# Patient Record
Sex: Male | Born: 1945 | Race: White | Hispanic: No | Marital: Married | Smoking: Never smoker
Health system: Southern US, Academic
[De-identification: ages and names within clinical notes are randomized; demographics above are authoritative.]

## PROBLEM LIST (undated history)

## (undated) DIAGNOSIS — Z85038 Personal history of other malignant neoplasm of large intestine: Secondary | ICD-10-CM

## (undated) DIAGNOSIS — C189 Malignant neoplasm of colon, unspecified: Secondary | ICD-10-CM

## (undated) DIAGNOSIS — M810 Age-related osteoporosis without current pathological fracture: Secondary | ICD-10-CM

## (undated) DIAGNOSIS — E039 Hypothyroidism, unspecified: Secondary | ICD-10-CM

## (undated) HISTORY — DX: Hypothyroidism, unspecified: E03.9

## (undated) HISTORY — PX: ILEOSTOMY: SHX1783

## (undated) HISTORY — DX: Personal history of other malignant neoplasm of large intestine: Z85.038

## (undated) HISTORY — PX: COLONOSCOPY: WVUENDOPRO10

## (undated) HISTORY — DX: Age-related osteoporosis without current pathological fracture: M81.0

---

## 1994-07-24 ENCOUNTER — Emergency Department (HOSPITAL_COMMUNITY): Payer: Self-pay

## 2000-09-26 ENCOUNTER — Other Ambulatory Visit: Payer: Self-pay

## 2002-11-12 ENCOUNTER — Other Ambulatory Visit: Payer: Self-pay

## 2008-05-27 HISTORY — PX: HX SMALL BOWEL RESECTION: SHX150

## 2009-02-02 ENCOUNTER — Other Ambulatory Visit: Payer: Self-pay | Admitting: Surgery

## 2009-03-28 ENCOUNTER — Other Ambulatory Visit: Payer: Self-pay | Admitting: Surgery

## 2010-02-15 ENCOUNTER — Other Ambulatory Visit: Payer: Self-pay | Admitting: Surgery

## 2010-09-09 ENCOUNTER — Emergency Department (EMERGENCY_DEPARTMENT_HOSPITAL): Payer: BC Managed Care – PPO

## 2010-09-09 ENCOUNTER — Emergency Department (EMERGENCY_DEPARTMENT_HOSPITAL)
Admission: EM | Admit: 2010-09-09 | Discharge: 2010-09-10 | Disposition: A | Discharge status: Home / Self Care | Payer: BC Managed Care – PPO

## 2011-02-25 ENCOUNTER — Other Ambulatory Visit: Payer: Self-pay | Admitting: Surgery

## 2011-04-04 HISTORY — PX: HX CATARACT REMOVAL: SHX102

## 2011-04-25 HISTORY — PX: HX CATARACT REMOVAL: SHX102

## 2011-07-02 ENCOUNTER — Other Ambulatory Visit (INDEPENDENT_AMBULATORY_CARE_PROVIDER_SITE_OTHER): Payer: Self-pay | Admitting: UHC-FAMILY MEDICINE

## 2011-07-02 MED ORDER — LEVOTHYROXINE 50 MCG TABLET
50.0000 ug | ORAL_TABLET | Freq: Every day | ORAL | Status: DC
Start: 2011-07-02 — End: 2012-03-23

## 2012-03-02 ENCOUNTER — Other Ambulatory Visit: Payer: Self-pay | Admitting: Surgery

## 2012-03-19 ENCOUNTER — Encounter (INDEPENDENT_AMBULATORY_CARE_PROVIDER_SITE_OTHER): Payer: Self-pay | Admitting: UHC-FAMILY MEDICINE

## 2012-03-19 ENCOUNTER — Ambulatory Visit (INDEPENDENT_AMBULATORY_CARE_PROVIDER_SITE_OTHER): Payer: BC Managed Care – PPO | Admitting: UHC-FAMILY MEDICINE

## 2012-03-19 VITALS — BP 144/94 | HR 78 | Temp 97.0°F | Resp 16 | Ht 71.0 in | Wt 163.0 lb

## 2012-03-19 LAB — THYROXINE, FREE (FREE T4): THYROXINE, FREE (FREE T4): 1.02 ng/dL (ref 0.61–1.12)

## 2012-03-19 LAB — THYROID STIMULATING HORMONE (SENSITIVE TSH): TSH: 2.96 u[IU]/mL (ref 0.340–5.600)

## 2012-03-19 MED ORDER — SILDENAFIL 25 MG TABLET
25.00 mg | ORAL_TABLET | ORAL | Status: DC | PRN
Start: 2012-03-19 — End: 2013-06-28

## 2012-03-20 NOTE — Progress Notes (Addendum)
 [facility]  [facility]  [address]  [place] [address]  [phone]          Encounter Date: 03/19/2012  2:45 PM EDT      Name: [name]  Age: 66 y.o.  DOB: Sep 15, 1945  Sex: male    Chief Complaint:   Chief Complaint   Patient presents with   . Thyroid      Follow Up       HPI  Patient here for f/u. He works at [employer] center. Had blood work done last August. Pt needs refill of synthroid . He wants his TSH recheck because he feels fatigue most of the time lately.  He also wants viagra  prescription.  BP elevated in office. Pt asymptomatic.  Pt has no other complains.  Had his flu shot at [facility].    History  Past Medical History   Diagnosis Date   . Hypothyroid    . Osteoporosis    . H/O colon cancer, stage I      Past Surgical History   Procedure Laterality Date   . Colonoscopy     . Hx small bowel resection  2010       Review of Systems   Constitutional: Negative for fever, chills, weight loss and diaphoresis.   Cardiovascular: Negative for chest pain, palpitations, orthopnea and leg swelling.   Gastrointestinal: Negative for nausea, vomiting, abdominal pain, diarrhea, constipation and blood in stool.   Skin: Negative for rash.       Examination  Vitals: BP 144/94  Pulse 78  Temp(Src) 36.1 C (97 F)  Resp 16  Ht 1.803 m (5' 11)  Wt 73.936 kg (163 lb)  BMI 22.74 kg/m2  SpO2 99%  Physical Exam   Constitutional: He is oriented to person, place, and time.   Cardiovascular: Normal rate and regular rhythm.    No murmur heard.  Pulm:  Effort normal.    There are no rales present.    There are no wheezes present.    Abdomen:   Soft. No tenderness.   Ortho/Musculoskeletal:   Normal range of motion. He exhibits no edema and no tenderness.   Neurological: He is alert and oriented to person, place, and time.   Psychiatric: He has a normal mood and affect. His behavior is normal.     .    Assessment and Plan    Hypothyroidism  - TSH; Future  - FREE T4; Future  -   Continue current dose of synthroid     Erectile dysfunction  - Sildenafil  (VIAGRA ) 25 mg Oral Tablet; Take 1 Tab (25 mg total) by mouth Every 24 hours as needed    Elevated BP  - continue to monitor, pt asymptomatic  - if still elevated on f/u will start medication    Return in about 3 months (around 06/19/2012) for follow up.      Ragena JONELLE Asp, MD      SUPERVISING PHYSICIAN'S STATEMENT    History/Reason for today's visit: As stated in above note.  Exam: As per resident note.  Assessment/Plan: As per resident note.  Preceptor for this encounter: Dr. CANDIE Benton Pfeiffer and prior to or immediately following the patient's discharge today, I reviewed and concur with the history, examination, tests ordered, diagnosis, and plan of the resident physician.    Elvie Benton Pfeiffer, DO

## 2012-03-23 ENCOUNTER — Other Ambulatory Visit (INDEPENDENT_AMBULATORY_CARE_PROVIDER_SITE_OTHER): Payer: Self-pay | Admitting: UHC-FAMILY MEDICINE

## 2012-03-23 MED ORDER — LEVOTHYROXINE 50 MCG TABLET
50.0000 ug | ORAL_TABLET | Freq: Every day | ORAL | Status: DC
Start: 2012-03-23 — End: 2012-03-27

## 2012-03-27 ENCOUNTER — Telehealth (INDEPENDENT_AMBULATORY_CARE_PROVIDER_SITE_OTHER): Payer: Self-pay | Admitting: UHC-FAMILY MEDICINE

## 2012-03-27 MED ORDER — LEVOTHYROXINE 25 MCG TABLET
25.0000 ug | ORAL_TABLET | Freq: Every day | ORAL | Status: DC
Start: 2012-03-27 — End: 2012-09-22

## 2012-03-27 NOTE — Telephone Encounter (Signed)
Patient has been on 50 mcg of Synthroid for past six weeks after being found to have elevated TSH at a health fair.  After six weeks, TSH was in normal range.  Patient was complaining of palpitations, leg pain, and fatigue that had been present for about five days.  He stopped taking Synthroid and symptoms resolved.  Will lower dose of Synthroid to 25 mcg daily and repeat TSH in 6 weeks.    Cassey Bacigalupo Brock Curstin Schmale, MD 03/27/2012, 10:50 AM

## 2012-09-22 ENCOUNTER — Encounter (INDEPENDENT_AMBULATORY_CARE_PROVIDER_SITE_OTHER): Payer: Self-pay | Admitting: UHC-FAMILY MEDICINE

## 2012-09-22 ENCOUNTER — Ambulatory Visit (INDEPENDENT_AMBULATORY_CARE_PROVIDER_SITE_OTHER): Payer: BC Managed Care – PPO | Admitting: UHC-FAMILY MEDICINE

## 2012-09-22 VITALS — BP 130/84 | HR 62 | Temp 98.0°F | Resp 16 | Ht 71.0 in | Wt 162.0 lb

## 2012-09-22 MED ORDER — LEVOTHYROXINE 25 MCG TABLET
25.0000 ug | ORAL_TABLET | Freq: Every day | ORAL | Status: DC
Start: 2012-09-22 — End: 2013-03-05

## 2012-09-22 NOTE — Progress Notes (Addendum)
[facility]  [facility]  [address]  [place] [address]  [phone]          Encounter Date: 09/22/2012  3:15 PM EDT      Name: [name]  Age: 67 y.o.  DOB: Feb 25, 1946  Sex: male    Chief Complaint:   Chief Complaint   Patient presents with   . Neck Pain   . Medication Question       HPI  Pt here for f/u of hypothyroidism.  Brought his annual employee lab results done last March.  Chemistry WNL, CBC WNL, LDL 104, HDL 45, TG 54. TSH done a week ago at 3.6.  He's taking levothyroxine .  He feels jittery with this dose.  He used to take 25 mcg.  He wants to go back to usual dose.    Pt diagnosed with ulcerative colitis and gets colonocopy every year by Dr. Rubye Oaks.  He also complains of neck pain.  Feels like stiff.  Takes motrin which helps with pain.  Moving makes pain worse.  Pain is mild.  No radiation to arms.  No swelling. No weakness    Current Outpatient Prescriptions   Medication Sig   . alendronate (FOSAMAX) 70 mg Oral Tablet Take 70 mg by mouth Every 7 days   . CALCIUM CARBONATE/VITAMIN D3 (CALCIUM 600 + D,3, ORAL) Take by mouth   . cholecalciferol, vitamin D3, 1,000 unit Oral Tablet Take 1,000 Units by mouth Once a day   . levothyroxine (SYNTHROID) 25 mcg Oral Tablet Take 1 Tab (25 mcg total) by mouth Once a day   . Sildenafil (VIAGRA) 25 mg Oral Tablet Take 1 Tab (25 mg total) by mouth Every 24 hours as needed   . sulfaDIAZINE (MICROSULFON) 500 mg Oral Tablet Take 500 mg by mouth Every 6 hours       Review of Systems   Constitutional: Negative for fever and malaise/fatigue.   HENT: Positive for neck pain. Negative for ear pain and sore throat.    Respiratory: Negative for shortness of breath.    Cardiovascular: Negative for chest pain and palpitations.   Gastrointestinal: Negative for abdominal pain, diarrhea and constipation.   Neurological: Negative for tingling, sensory change and focal weakness.       Examination   Vitals: BP 130/84   Pulse 62   Temp(Src) 36.7 C (98 F)   Resp 16   Ht 1.803 m (5\' 11" )   Wt 73.483 kg (162 lb)   BMI 22.6 kg/m2   SpO2 100%  Physical Exam   Constitutional: He is oriented to person, place, and time.   Neck: Normal range of motion. Neck supple. No JVD present. No tracheal deviation present. No thyromegaly present.   Cardiovascular: Normal rate and regular rhythm.    Pulm:  Effort normal and breath sounds normal.    Abdomen:   Soft. No tenderness.   Ortho/Musculoskeletal:   Normal range of motion. He exhibits no edema.   Lymphadenopathy:     He has no cervical adenopathy.   Neurological: He is alert and oriented to person, place, and time.   Skin: No rash noted.   Psychiatric: He has a normal mood and affect. His behavior is normal.     .    Assessment and Plan    Hypothyroid  - TSH; Future, to be done after 4 weeks of decreasing dose of synthroid  -  Will lower dose to 25  mcg, eRX levothyroxine (SYNTHROID) 25 mcg Oral Tablet; Take 1 Tab (25 mcg total) by mouth Once a day    Neck pain on right side  -  Most likely muscle strain  -  Advised home exercise and may take tylenol or ibuprofen prn for pain  -  Will monitor, if perist will order xray and refer to PT      Return in about 4 weeks (around 10/20/2012) for follow up.      Shaune Spittle, MD    ___________________________________________________    History/Reason for today's visit: As stated in above note.    Exam: As per resident note.    Assessment/Plan: As per resident note.      Preceptor for this encounter: Victorico Singzon M.D. and prior to or immediately following the patient's discharge today, I reviewed and concur with the history, examination, tests ordered, diagnosis, and plan of the resident physician.    VICTORICO A. Macie Burows, M.D.  [facility]  [address]  [place] [address]  Dept: [phone]  Dept Fax: [phone]

## 2012-09-29 ENCOUNTER — Encounter (INDEPENDENT_AMBULATORY_CARE_PROVIDER_SITE_OTHER): Payer: BC Managed Care – PPO | Admitting: UHC-FAMILY MEDICINE

## 2012-11-10 ENCOUNTER — Encounter (INDEPENDENT_AMBULATORY_CARE_PROVIDER_SITE_OTHER): Payer: BC Managed Care – PPO | Admitting: UHC-FAMILY MEDICINE

## 2013-02-08 LAB — THYROID STIMULATING HORMONE (SENSITIVE TSH): TSH: 2.42 u[IU]/mL (ref 0.340–5.600)

## 2013-02-11 ENCOUNTER — Encounter (INDEPENDENT_AMBULATORY_CARE_PROVIDER_SITE_OTHER): Payer: BC Managed Care – PPO | Admitting: Family Medicine

## 2013-02-16 ENCOUNTER — Encounter (INDEPENDENT_AMBULATORY_CARE_PROVIDER_SITE_OTHER): Payer: Self-pay | Admitting: Family Medicine

## 2013-02-16 ENCOUNTER — Ambulatory Visit (INDEPENDENT_AMBULATORY_CARE_PROVIDER_SITE_OTHER): Payer: BC Managed Care – PPO | Admitting: Family Medicine

## 2013-02-16 VITALS — BP 160/90 | HR 69 | Temp 97.0°F | Resp 20 | Ht 71.5 in | Wt 161.0 lb

## 2013-02-16 MED ORDER — PROPRANOLOL 10 MG TABLET
10.00 mg | ORAL_TABLET | ORAL | Status: DC
Start: 2013-02-16 — End: 2016-08-13

## 2013-02-16 NOTE — Progress Notes (Addendum)
 [facility]  [address]  [place], [place]  [address]  [phone]    [name]  Jul 18, 1945  02/16/2013 at  2:30 PM EDT    Chief complaint:   Chief Complaint   Patient presents with   . Hypothyroid       Subjective  Patient is 67 y.o. male here to follow up on hypothyroidism. Taking medication with no problem and has no complaints. BP elevated today, but patient states that normally it is much better than this. No chest pains, dizziness, or vision changes. 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 He would like something to perhaps help with it. Also has history of chrohn's disease that he is on sulfazine for and has colonoscopy Oct 9 to see if maybe he can stop this medicine.     Labs from 02/08/13 scanned in and reviewed. MCV elevated-possibly related to medication.     ROS: No fever, chills, nausea, vomiting, CP, SOB or other acute issues.     Current Outpatient Prescriptions   Medication Sig   . alendronate (FOSAMAX) 70 mg Oral Tablet Take 70 mg by mouth Every 7 days   . CALCIUM CARBONATE/VITAMIN D3 (CALCIUM 600 + D,3, ORAL) Take by mouth   . cholecalciferol, vitamin D3, 1,000 unit Oral Tablet Take 1,000 Units by mouth Once a day   . levothyroxine  (SYNTHROID ) 25 mcg Oral Tablet Take 1 Tab (25 mcg total) by mouth Once a day   . propranolol  (INDERAL ) 10 mg Oral Tablet Take 1 Tab (10 mg total) by mouth Per instructions Take one tab PO every 6 hours as needed for tremor   . Sildenafil  (VIAGRA ) 25 mg Oral Tablet Take 1 Tab (25 mg total) by mouth Every 24 hours as needed   . sulfaDIAZINE (MICROSULFON) 500 mg Oral Tablet Take 500 mg by mouth Every 6 hours       Objective  BP 160/90  Pulse 69  Temp(Src) 36.1 C (97 F)  Resp 20  Ht 1.816 m (5' 11.5)  Wt 73.029 kg (161 lb)  BMI 22.14 kg/m2  SpO2 98%   Repeat BP 146/84    BP Readings from Last 4 Encounters:   02/16/13 160/90   09/22/12 130/84   03/19/12 144/94              General Appearance: AAOx3. NAD.  Lungs: CTAB/No WRR.  Heart: RRR/S1S2. No murmurs.  Abdomen: S/NT/ND/BS+  Extremities: No edema.   Neurologic: Coarse bilateral hand tremor bilaterally with intent and at rest. Strength and sensation intact bilateral upper and lower extremities. CN 2-12 grossly intact.   Psychiatric: Normal mood and affect.    Assessment/Plan    ICD-9-CM    1. Hypothyroid 244.9 Stable, continue current rx   2. 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 Elevated BP 796.2 Monitor       The patient was given the opportunity to ask questions and those questions were answered to the patient's satisfaction. The patient was encouraged to call with any additional questions or concerns. Discussed with patient effects and side effects of medication. Instructed patient to call back if symptoms worse.     Return in about 3 months (around 05/18/2013) for f/u tremor.    Darrell LILLETTE Hover, MD 02/18/2013, 1:56 PM  [facility]  [address]  [place] [address]  Dept: [phone]  Dept Fax: [phone]     ___________________________________________________    History/Reason for today's visit: As stated in above note.    Exam: As per resident note.    Assessment/Plan: As per resident note.      Preceptor for this encounter: Victorico Singzon M.D. and prior to or immediately following the patient's discharge today, I reviewed and concur with the history, examination, tests ordered, diagnosis, and plan of the resident physician.    Victorico A. Singzon MD    [address] [place] [address]  Dept: [phone]  Dept Fax: [phone]

## 2013-02-17 ENCOUNTER — Encounter (INDEPENDENT_AMBULATORY_CARE_PROVIDER_SITE_OTHER): Payer: Self-pay | Admitting: Family Medicine

## 2013-02-19 ENCOUNTER — Encounter (INDEPENDENT_AMBULATORY_CARE_PROVIDER_SITE_OTHER): Payer: Self-pay | Admitting: Family Medicine

## 2013-02-22 ENCOUNTER — Encounter (INDEPENDENT_AMBULATORY_CARE_PROVIDER_SITE_OTHER): Payer: Self-pay | Admitting: Family Medicine

## 2013-02-25 ENCOUNTER — Encounter (INDEPENDENT_AMBULATORY_CARE_PROVIDER_SITE_OTHER): Payer: Self-pay | Admitting: Family Medicine

## 2013-03-04 ENCOUNTER — Other Ambulatory Visit: Payer: Self-pay | Admitting: Surgery

## 2013-03-05 ENCOUNTER — Encounter (INDEPENDENT_AMBULATORY_CARE_PROVIDER_SITE_OTHER): Payer: Self-pay | Admitting: Family Medicine

## 2013-03-05 ENCOUNTER — Other Ambulatory Visit (INDEPENDENT_AMBULATORY_CARE_PROVIDER_SITE_OTHER): Payer: Self-pay | Admitting: Family Medicine

## 2013-03-05 DIAGNOSIS — K624 Stenosis of anus and rectum: Secondary | ICD-10-CM

## 2013-03-05 DIAGNOSIS — Z85038 Personal history of other malignant neoplasm of large intestine: Secondary | ICD-10-CM

## 2013-03-05 DIAGNOSIS — K5190 Ulcerative colitis, unspecified, without complications: Secondary | ICD-10-CM

## 2013-03-05 HISTORY — DX: Personal history of other malignant neoplasm of large intestine: Z85.038

## 2013-03-05 HISTORY — DX: Stenosis of anus and rectum: K62.4

## 2013-03-05 HISTORY — DX: Ulcerative colitis, unspecified, without complications (CMS HCC): K51.90

## 2013-03-05 MED ORDER — LEVOTHYROXINE 25 MCG TABLET
25.0000 ug | ORAL_TABLET | Freq: Every day | ORAL | Status: DC
Start: 2013-03-05 — End: 2013-08-05

## 2013-05-27 HISTORY — PX: HX OTHER: 2100001105

## 2013-06-28 ENCOUNTER — Other Ambulatory Visit (INDEPENDENT_AMBULATORY_CARE_PROVIDER_SITE_OTHER): Payer: Self-pay | Admitting: Family Medicine

## 2013-06-28 DIAGNOSIS — N529 Male erectile dysfunction, unspecified: Secondary | ICD-10-CM

## 2013-06-29 MED ORDER — SILDENAFIL 25 MG TABLET
25.0000 mg | ORAL_TABLET | ORAL | Status: DC | PRN
Start: 2013-06-28 — End: 2016-04-07

## 2013-08-05 ENCOUNTER — Other Ambulatory Visit (INDEPENDENT_AMBULATORY_CARE_PROVIDER_SITE_OTHER): Payer: Self-pay | Admitting: Family Medicine

## 2013-08-05 DIAGNOSIS — E039 Hypothyroidism, unspecified: Secondary | ICD-10-CM

## 2013-08-05 MED ORDER — LEVOTHYROXINE 50 MCG TABLET
50.0000 ug | ORAL_TABLET | Freq: Every day | ORAL | Status: DC
Start: 2013-08-05 — End: 2016-04-07

## 2013-08-12 ENCOUNTER — Other Ambulatory Visit (INDEPENDENT_AMBULATORY_CARE_PROVIDER_SITE_OTHER): Payer: Self-pay | Admitting: Family Medicine

## 2013-08-12 ENCOUNTER — Encounter (INDEPENDENT_AMBULATORY_CARE_PROVIDER_SITE_OTHER): Payer: Self-pay | Admitting: Family Medicine

## 2013-08-12 DIAGNOSIS — E039 Hypothyroidism, unspecified: Secondary | ICD-10-CM

## 2013-08-20 ENCOUNTER — Encounter (INDEPENDENT_AMBULATORY_CARE_PROVIDER_SITE_OTHER): Payer: Self-pay | Admitting: Family Medicine

## 2013-09-24 ENCOUNTER — Encounter (INDEPENDENT_AMBULATORY_CARE_PROVIDER_SITE_OTHER): Payer: BC Managed Care – PPO | Admitting: Surgery

## 2013-09-24 DIAGNOSIS — K625 Hemorrhage of anus and rectum: Secondary | ICD-10-CM

## 2013-09-24 DIAGNOSIS — K5180 Other ulcerative colitis without complications: Secondary | ICD-10-CM

## 2013-09-29 ENCOUNTER — Encounter (INDEPENDENT_AMBULATORY_CARE_PROVIDER_SITE_OTHER): Payer: Self-pay | Admitting: Family Medicine

## 2013-09-29 LAB — THYROID STIMULATING HORMONE (SENSITIVE TSH): TSH: 3.35 u[IU]/mL (ref 0.340–5.600)

## 2013-09-29 LAB — THYROXINE, FREE (FREE T4): THYROXINE, FREE (FREE T4): 1.01 ng/dL (ref 0.61–1.12)

## 2013-11-03 ENCOUNTER — Encounter (INDEPENDENT_AMBULATORY_CARE_PROVIDER_SITE_OTHER): Payer: Self-pay | Admitting: Family Medicine

## 2013-11-12 ENCOUNTER — Encounter (INDEPENDENT_AMBULATORY_CARE_PROVIDER_SITE_OTHER): Payer: BC Managed Care – PPO | Admitting: Surgery

## 2013-11-12 DIAGNOSIS — K625 Hemorrhage of anus and rectum: Secondary | ICD-10-CM

## 2013-11-12 DIAGNOSIS — Z85038 Personal history of other malignant neoplasm of large intestine: Secondary | ICD-10-CM

## 2013-11-30 LAB — THYROXINE, FREE (FREE T4): THYROXINE, FREE (FREE T4): 1.09 ng/dL (ref 0.61–1.12)

## 2013-11-30 LAB — THYROID STIMULATING HORMONE (SENSITIVE TSH): TSH: 4.421 u[IU]/mL (ref 0.340–5.600)

## 2013-12-07 ENCOUNTER — Encounter (INDEPENDENT_AMBULATORY_CARE_PROVIDER_SITE_OTHER): Payer: Self-pay | Admitting: Family Medicine

## 2013-12-09 ENCOUNTER — Encounter (INDEPENDENT_AMBULATORY_CARE_PROVIDER_SITE_OTHER): Payer: Self-pay | Admitting: Family Medicine

## 2014-03-07 ENCOUNTER — Other Ambulatory Visit: Payer: Self-pay | Admitting: Surgery

## 2014-03-07 DIAGNOSIS — Z980 Intestinal bypass and anastomosis status: Secondary | ICD-10-CM

## 2014-03-07 DIAGNOSIS — Z85038 Personal history of other malignant neoplasm of large intestine: Secondary | ICD-10-CM

## 2014-03-07 DIAGNOSIS — C187 Malignant neoplasm of sigmoid colon: Secondary | ICD-10-CM

## 2014-03-07 DIAGNOSIS — K625 Hemorrhage of anus and rectum: Secondary | ICD-10-CM

## 2014-03-07 DIAGNOSIS — K624 Stenosis of anus and rectum: Secondary | ICD-10-CM

## 2014-03-07 DIAGNOSIS — K5190 Ulcerative colitis, unspecified, without complications: Secondary | ICD-10-CM

## 2014-03-11 ENCOUNTER — Encounter (INDEPENDENT_AMBULATORY_CARE_PROVIDER_SITE_OTHER): Payer: BC Managed Care – PPO | Admitting: Surgery

## 2014-03-11 DIAGNOSIS — K5190 Ulcerative colitis, unspecified, without complications: Secondary | ICD-10-CM

## 2014-03-23 ENCOUNTER — Other Ambulatory Visit: Payer: Self-pay | Admitting: Surgery

## 2014-03-23 DIAGNOSIS — K5190 Ulcerative colitis, unspecified, without complications: Secondary | ICD-10-CM

## 2014-03-23 DIAGNOSIS — C187 Malignant neoplasm of sigmoid colon: Secondary | ICD-10-CM

## 2014-03-28 DIAGNOSIS — C187 Malignant neoplasm of sigmoid colon: Secondary | ICD-10-CM

## 2014-04-03 ENCOUNTER — Emergency Department (EMERGENCY_DEPARTMENT_HOSPITAL)
Admission: EM | Admit: 2014-04-03 | Discharge: 2014-04-03 | Payer: BC Managed Care – PPO | Attending: EMERGENCY MEDICINE | Admitting: EMERGENCY MEDICINE

## 2014-04-03 ENCOUNTER — Emergency Department (EMERGENCY_DEPARTMENT_HOSPITAL): Payer: BC Managed Care – PPO

## 2014-04-03 DIAGNOSIS — R112 Nausea with vomiting, unspecified: Secondary | ICD-10-CM

## 2014-04-03 DIAGNOSIS — E860 Dehydration: Secondary | ICD-10-CM

## 2014-04-03 DIAGNOSIS — R0602 Shortness of breath: Secondary | ICD-10-CM

## 2014-04-03 DIAGNOSIS — K5660 Unspecified intestinal obstruction: Secondary | ICD-10-CM

## 2014-04-06 ENCOUNTER — Encounter (INDEPENDENT_AMBULATORY_CARE_PROVIDER_SITE_OTHER): Payer: BC Managed Care – PPO | Admitting: Surgery

## 2014-04-13 ENCOUNTER — Encounter (INDEPENDENT_AMBULATORY_CARE_PROVIDER_SITE_OTHER): Payer: BC Managed Care – PPO | Admitting: Surgery

## 2014-04-13 DIAGNOSIS — Z09 Encounter for follow-up examination after completed treatment for conditions other than malignant neoplasm: Secondary | ICD-10-CM

## 2014-04-15 ENCOUNTER — Encounter (INDEPENDENT_AMBULATORY_CARE_PROVIDER_SITE_OTHER): Payer: Self-pay | Admitting: Family Medicine

## 2014-05-25 ENCOUNTER — Encounter (INDEPENDENT_AMBULATORY_CARE_PROVIDER_SITE_OTHER): Payer: Self-pay | Admitting: Family Medicine

## 2014-05-25 ENCOUNTER — Ambulatory Visit (INDEPENDENT_AMBULATORY_CARE_PROVIDER_SITE_OTHER): Payer: BC Managed Care – PPO | Admitting: Family Medicine

## 2014-05-25 DIAGNOSIS — D649 Anemia, unspecified: Secondary | ICD-10-CM | POA: Insufficient documentation

## 2014-05-25 DIAGNOSIS — E875 Hyperkalemia: Secondary | ICD-10-CM

## 2014-05-25 DIAGNOSIS — E039 Hypothyroidism, unspecified: Secondary | ICD-10-CM

## 2014-05-25 DIAGNOSIS — Z1159 Encounter for screening for other viral diseases: Secondary | ICD-10-CM

## 2014-05-25 DIAGNOSIS — Z6820 Body mass index (BMI) 20.0-20.9, adult: Secondary | ICD-10-CM

## 2014-05-25 NOTE — Progress Notes (Addendum)
[facility]  [address]  [place], [place] [address]  Phone: [phone]  Fax: [phone]          Name: [name]  MRN : [id number]  Date of service: 05/25/2014    Chief Complaint: Anemia    History of Present Illness:  [name] is a 68 y.o. male who presents as an established patient to the Family Medicine clinic at [facility].  Patient comes in for follow up. Had labs done recently per Dr. Tonye Royalty.   Anemia - He was found to be anemic. Hgb 10.1 Started on Ferrous gluconate. States he feels tired sometimes.  Hyperkalemia - was previously on Potassium pills. Was d/c last Monday. Will recheck levels next week. Patient is asymptomatic.  Hypothyroidism - follows with Dr. Tonye Royalty. He uses Armour thyroid pills as he can't tolerate synthroid. Recent TSH levels were WNL.  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 Patient was admitted last October after undergoing colonoscopy. 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 Doing well.     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    History of malignant neoplasm of large intestine 03/05/2013    Ulcerative colitis 03/05/2013           Medications:  Current Outpatient Prescriptions   Medication Sig Dispense Refill    alendronate (FOSAMAX) 70 mg Oral Tablet Take 70 mg by mouth Every 7 days      CALCIUM CARBONATE/VITAMIN D3 (CALCIUM 600 + D,3, ORAL) Take by mouth      cholecalciferol, vitamin D3, 1,000 unit Oral Tablet Take 1,000 Units by mouth Once a day      levothyroxine (SYNTHROID) 50 mcg Oral Tablet Take 1 Tab (50 mcg total) by mouth Once a day (Patient not taking: Reported on 05/25/2014) 30 Tab 3    propranolol (INDERAL) 10 mg Oral Tablet Take 1 Tab (10 mg total) by mouth Per instructions Take one tab PO every 6 hours as needed for tremor 60 Tab 3    Sildenafil (VIAGRA) 25 mg Oral  Tablet Take 1 Tab (25 mg total) by mouth Every 24 hours as needed (Patient not taking: Reported on 05/25/2014) 7 Tab 1    sulfaDIAZINE (MICROSULFON) 500 mg Oral Tablet Take 500 mg by mouth Every 6 hours      thyroid (ARMOUR THYROID) 60 mg Oral Tablet Take 60 mg by mouth Once a day       No current facility-administered medications for this visit.       Problem List:  Patient Active Problem List   Diagnosis    Hypothyroid    Neck pain    Elevated BP    Stenosis of rectum    History of malignant neoplasm of large intestine    Ulcerative colitis    Hyperkalemia       Review of Systems:  Constitutional - no appetite or weight changes, (+)fatigue , no fevers, chills, or night sweats  Respiratory - no dyspnea or cough  Cardiovascular - no chest pain or palpitations  Gastrointestinal - no nausea, vomiting, diarrhea, or constipation, no dyspepsia  Endocrine - no heat or cold intolerance, no polyuria, polydipsia, or polyphagia  Skin - no rashes, color changes, or lesions  Musculoskeletal - no arthralgias or myalgias  Genitourinary - no urinary  frequency or dysuria, no genital discharge  Neurologic - no vision or hearing changes, no weakness, no parasthesias   Psychiatric - mood has been appropriate, no depression or anxiety    Physical Examination:  BP 137/84 mmHg   Pulse 86   Temp(Src) 37.1 C (98.8 F)   Resp 20   Ht 1.803 m ($Remove'5\' 11"'vWxlknp$ )   Wt 66.225 kg (146 lb)   BMI 20.37 kg/m2   SpO2 100%  General Appearance: AF. VSS. AAOx3. NAD.  HEENT: (+) pink palpebral conjunctivae.  Neck: Trachea midline. No cervical lymphadenopathy. Thyroid not enlarged.   Lungs: Nonlabored with symmetric movement. CTAB/No WRR.  Heart: RRR/S1S2. No murmurs. Pulses equal bilaterally.  Abdomen: S/NT/ND/BS+/no hepatospenomegaly. (+)ileostomy LLQ  Extremities: Warm and dry. No edema. Intact strength and sensation.   Neuro: CN II-XII grossly intact, normal gait.  Psychiatric: Normal mood and affect. No SI/HI.    Results:  TEST: TSH     Result Name  Results Units Reference Range   TSH  4.171  uIU/mL  0.340-5.600     TEST: LDH     Result Name Results Units Reference Range   LDH  144  U/L  98-192     TEST: LIPID PANEL     Result Name Results Units Reference Range   CHOLESTEROL  137  mg/dL  0-199   TRIGLYCERIDES  59  mg/dL  0-199   HDL  44  mg/dL  29-71   LDL DIRECT  75  mg/dL  0-99   VLDL  12  mg/dL  0-50     TEST: COMPREHENSIVE METABOLIC PANEL     Result Name Results Units Reference Range   SODIUM  138  mEq/L  136-144   POTASSIUM  5.3 H  mEq/L  3.6-5.1   CHLORIDE  103  mEq/L  101-111   CO2 TOTAL  27  mEq/L  22-32   BUN  20  mg/dL  8-20   CREATININE, SERUM  0.6  mg/dL  0.6-1.2   GLUCOSE  78  mg/dL  70-110   CALCIUM  9.7  mg/dL  8.9-10.3   BILIRUBIN, TOTAL  0.3  mg/dL  0.3-1.2   Full Term:   0.0 - 6.0 (Birth to 6 days)  Premature: 0.0 - 12.9 (Birth to 7 days)  T. Bilirubin Reference Range for Newborns: Full Term:   0.0 - 6.0 (Birth  to 6 days); Premature: 0.0 - 12.9 (Birth to 7 days)   Samples from patients who have taken Naproxen have shown spurious  elevation in Total Bilirubin levels due to metabolite  O-desmethylnaproxen interference with Jendrassik-Grof methods.   ALK. PHOSPHATASE  51  U/L  20-130   AST  16  U/L  8-33   ALT  16  U/L  4-36   PROTEIN, TOTAL  6.4  gm/dL  6.4-8.3   ALBUMIN  3.3 L  gm/dL  3.5-4.8   eGFR  >60 A   Avg. 85     TEST: PHOSPHORUS     Result Name Results Units Reference Range   PHORPHORUS, INORGANIC  4.9 H  mg/dL  2.4-4.7     TEST: URIC ACID     Result Name Results Units Reference Range   URIC ACID  3.0 L  mg/dL  4.8-8.7     TEST: CBC     Result Name Results Units Reference Range   WBC  6.5  10^3/uL  3.3-9.3   RBC  3.52 L  10^6/uL  4.40-5.68   HEMOGLOBIN  10.1 L  gm/dL  13.4-17.3   HEMATOCRIT  31.0 L  %  38.9-50.5   MCV  88.0  fl  82.4-95.0   MCH  28.6  pg  27.9-33.1   MCHC  32.5 L  gm/dL  32.8-36.0   RDW  17.1 H  %  10.9-15.1   PLATELET COUNT  341  10^3/uL  140-440   MPV  8.0  fl  6.0-10.2   % GRANULOCYTES  65.6  %     % LYMPHOCYTE   19.1  %     % MONOCYTE  10.1  %     % EOSINOPHILS  4.7  %     % BASOPHILS  0.5  %     # GRANULOCYTE  4.3  10^3/uL  1.6-5.5   # LYMPHOCYTES  1.2  10^3/uL  0.8-3.2   # MONOCYTES  0.7  10^3/uL  0.2-0.8   # EOSINOPHILS  0.3  10^3/uL  0.0-0.5   # BASOPHILS  0.00  10^3/uL  0.00-0.20     TEST: MAGNESIUM     Result Name Results Units Reference Range   MAGNESIUM  2.0  mg/dL  1.8-2.5     Assessment and Plan:    ICD-10-CM    1. Anemia, unspecified anemia type D64.9 FERRITIN     IRON     TRANSFERRIN     TOTAL IRON BINDING CAPACITY     VITAMIN B12   FOLATE        Continue Ferrous Gluconate.         2. Hypothyroidism, unspecified hypothyroidism type E03.9 Follows with Dr. Tonye Royalty. Continue Armour thyroid.   3. Hyperkalemia E87.5 POTASSIUM   4. Need for hepatitis C screening test Z11.59 HEPATITIS C ANTIBODY SCREEN WITH REFLEX TO HCV PCR   5. Hyperphosphatemia E83.89 PHOSPHORUS       If anything comes up in the mean time, I advised the patient to let me know and I would be more than happy to speak with them. If it cannot wait to please go to Urgent Care or ER.   The patient was given the opportunity to ask questions and those questions were answered to the patient's satisfaction. The patient was encouraged to call with any additional questions or concerns. Instructed patient to call back if symptoms worse.     Orders Placed This Encounter    FERRITIN    IRON    TRANSFERRIN    TOTAL IRON BINDING CAPACITY    POTASSIUM (K)    B12    FOLATE    ANTI HCV       Education  Medication safety and fall prevention    Follow up: Return in about 3 months (around 08/24/2014) for Anemia.        Durenda Age, MD 05/25/2014, 16:10    [facility]  [address] [place] [address]   Dept: [phone]   Dept Fax: [phone]       ___________________________________________________  SUPERVISING PHYSICIAN'S STATEMENT:    History/Reason for today's visit: As stated in above note.   Exam: As per resident note.      Assessment/Plan: As per resident note.   Preceptor for this encounter: Candis Musa M.D. and prior to or immediately following the patient's discharge today, I reviewed and concur with the history, examination, tests ordered, diagnosis, and plan of the resident physician.    Candis Musa, M.D.    [address]  [place], [place] [address]  Phone: [phone]  Fax:     [phone]

## 2014-05-25 NOTE — Patient Instructions (Addendum)
1. Continue to take medications as directed.   2. Follow-up as scheduled.   3. If any problems, call the family medicine clinic at [phone] during normal business hours, call the hospital operator after hours and ask for the resident on call for family medicine, or go to the emergency department for immediate evaluation if it is an emergency.    Patient was counseled about lifestyle modification including increasing physical activity and proper diet including balanced diet and portion control. Patient also counseled about age-appropriate health maintenance issues including obtaining regular lab work, eye exams, dental exams, screening procedures/tests, vaccination, and appropriate vitamin supplementation. Also instructed to follow-up with specialists as scheduled.  Anemia, Nonspecific  Anemia is a condition in which the concentration of red blood cells or hemoglobin in the blood is below normal. Hemoglobin is a substance in red blood cells that carries oxygen to the tissues of the body. Anemia results in not enough oxygen reaching these tissues.   CAUSES   Common causes of anemia include:    Excessive bleeding. Bleeding may be internal or external. This includes excessive bleeding from periods (in women) or from the intestine.    Poor nutrition.    Chronic kidney, thyroid, and liver disease.   Bone marrow disorders that decrease red blood cell production.   Cancer and treatments for cancer.   HIV, AIDS, and their treatments.   Spleen problems that increase red blood cell destruction.   Blood disorders.   Excess destruction of red blood cells due to infection, medicines, and autoimmune disorders.  SIGNS AND SYMPTOMS    Minor weakness.    Dizziness.    Headache.   Palpitations.    Shortness of breath, especially with exercise.    Paleness.   Cold sensitivity.   Indigestion.   Nausea.   Difficulty sleeping.   Difficulty concentrating.  Symptoms may occur suddenly or they may develop  slowly.   DIAGNOSIS   Additional blood tests are often needed. These help your health care provider determine the best treatment. Your health care provider will check your stool for blood and look for other causes of blood loss.   TREATMENT   Treatment varies depending on the cause of the anemia. Treatment can include:    Supplements of iron, vitamin M38, or folic acid.    Hormone medicines.    A blood transfusion. This may be needed if blood loss is severe.    Hospitalization. This may be needed if there is significant continual blood loss.    Dietary changes.   Spleen removal.  HOME CARE INSTRUCTIONS  Keep all follow-up appointments. It often takes many weeks to correct anemia, and having your health care provider check on your condition and your response to treatment is very important.  SEEK IMMEDIATE MEDICAL CARE IF:    You develop extreme weakness, shortness of breath, or chest pain.    You become dizzy or have trouble concentrating.   You develop heavy vaginal bleeding.    You develop a rash.    You have bloody or black, tarry stools.    You faint.    You vomit up blood.    You vomit repeatedly.    You have abdominal pain.   You have a fever or persistent symptoms for more than 2-3 days.    You have a fever and your symptoms suddenly get worse.    You are dehydrated.   MAKE SURE YOU:   Understand these instructions.   Will watch your condition.  Will get help right away if you are not doing well or get worse.     This information is not intended to replace advice given to you by your health care provider. Make sure you discuss any questions you have with your health care provider.     Document Released: 06/20/2004 Document Revised: 01/13/2013 Document Reviewed: 11/06/2012  [facility] Patient Information 2015 [place], [place].

## 2014-05-30 LAB — IRON TRANSFERRIN AND TIBC
IRON BINDING CAPACITY: 340 ug/dL (ref 221–468)
IRON SATURATION: 8 % — ABNORMAL LOW (ref 20–50)
IRON: 26 ug/dL — ABNORMAL LOW (ref 45–182)
TRANSFERRIN: 243 mg/dL (ref 180–329)

## 2014-05-30 LAB — VITAMIN B12: VITAMIN B12: >1500 pg/mL — ABNORMAL HIGH (ref 180–914)

## 2014-05-30 LAB — FERRITIN: FERRITIN: 12 ng/mL — ABNORMAL LOW (ref 24–336)

## 2014-05-30 LAB — POTASSIUM: POTASSIUM: 4.4 mEq/L (ref 3.6–5.1)

## 2014-05-30 LAB — FOLATE: FOLATE: >24.8 ng/mL — AB (ref >5.9)

## 2014-05-30 LAB — HEPATITIS C ANTIBODY SCREEN WITH REFLEX TO HCV PCR: HEPATITIS C ANTIBODY: NEGATIVE

## 2014-06-02 LAB — PHOSPHORUS: PHOSPHORUS: 4.3 mg/dL (ref 2.4–4.7)

## 2014-06-09 ENCOUNTER — Emergency Department (EMERGENCY_DEPARTMENT_HOSPITAL)
Admission: EM | Admit: 2014-06-09 | Discharge: 2014-06-12 | Payer: BC Managed Care – PPO | Attending: EMERGENCY MEDICINE | Admitting: EMERGENCY MEDICINE

## 2014-06-09 ENCOUNTER — Emergency Department (EMERGENCY_DEPARTMENT_HOSPITAL): Payer: BC Managed Care – PPO

## 2014-06-09 DIAGNOSIS — K458 Other specified abdominal hernia without obstruction or gangrene: Secondary | ICD-10-CM

## 2014-06-10 DIAGNOSIS — Z85038 Personal history of other malignant neoplasm of large intestine: Secondary | ICD-10-CM

## 2014-06-10 DIAGNOSIS — K9419 Other complications of enterostomy: Secondary | ICD-10-CM

## 2014-06-10 DIAGNOSIS — K5660 Unspecified intestinal obstruction: Secondary | ICD-10-CM

## 2014-06-29 ENCOUNTER — Encounter (INDEPENDENT_AMBULATORY_CARE_PROVIDER_SITE_OTHER): Payer: Self-pay | Admitting: HEMATOLOGY/ONCOLOGY

## 2014-06-29 ENCOUNTER — Encounter (INDEPENDENT_AMBULATORY_CARE_PROVIDER_SITE_OTHER): Payer: BC Managed Care – PPO | Admitting: Family Medicine

## 2014-06-29 ENCOUNTER — Encounter (INDEPENDENT_AMBULATORY_CARE_PROVIDER_SITE_OTHER): Payer: BC Managed Care – PPO | Admitting: Surgery

## 2014-06-29 ENCOUNTER — Encounter (INDEPENDENT_AMBULATORY_CARE_PROVIDER_SITE_OTHER): Payer: Self-pay | Admitting: Surgery

## 2014-06-29 DIAGNOSIS — Z09 Encounter for follow-up examination after completed treatment for conditions other than malignant neoplasm: Secondary | ICD-10-CM

## 2014-07-13 ENCOUNTER — Encounter (INDEPENDENT_AMBULATORY_CARE_PROVIDER_SITE_OTHER): Payer: Self-pay | Admitting: Family Medicine

## 2014-07-13 ENCOUNTER — Ambulatory Visit (INDEPENDENT_AMBULATORY_CARE_PROVIDER_SITE_OTHER): Payer: BC Managed Care – PPO | Admitting: Family Medicine

## 2014-07-13 ENCOUNTER — Encounter (INDEPENDENT_AMBULATORY_CARE_PROVIDER_SITE_OTHER): Payer: Self-pay

## 2014-07-13 VITALS — HR 73 | Temp 96.9°F | Resp 20 | Ht 72.0 in | Wt 147.0 lb

## 2014-07-13 DIAGNOSIS — Z0000 Encounter for general adult medical examination without abnormal findings: Secondary | ICD-10-CM

## 2014-07-13 DIAGNOSIS — Z681 Body mass index (BMI) 19 or less, adult: Secondary | ICD-10-CM

## 2014-07-13 NOTE — Patient Instructions (Signed)
1. Continue to take medications as directed.   2. Follow-up as scheduled.   3. If any problems, call the family medicine clinic at [phone] during normal business hours, call the hospital operator after hours and ask for the resident on call for family medicine, or go to the emergency department for immediate evaluation if it is an emergency.    Patient was counseled about lifestyle modification including increasing physical activity and proper diet including balanced diet and portion control. Patient also counseled about age-appropriate health maintenance issues including obtaining regular lab work, eye exams, dental exams, screening procedures/tests, vaccination, and appropriate vitamin supplementation. Also instructed to follow-up with specialists as scheduled.

## 2014-07-14 NOTE — Progress Notes (Addendum)
[facility]  [address]  [place], [place] [address]  Phone: [phone]  Fax: [phone]          Name: [name]  MRN : [id number]  Date of service: 07/13/2014    Chief Complaint: Annual Wellness Exam    History of Present Illness:  [name] is a 69 y.o. male who presents as an established patient to the Family Medicine clinic at [facility].  Patient comes in for his wellness check. Patient doing well. No complaints. Denies fever, chills, nausea, vomiting, chest pain, or shortness of breath.    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    History of malignant neoplasm of large intestine 03/05/2013    Ulcerative colitis 03/05/2013           Medications:  Current Outpatient Prescriptions   Medication Sig Dispense Refill    alendronate (FOSAMAX) 70 mg Oral Tablet Take 70 mg by mouth Every 7 days      CALCIUM CARBONATE/VITAMIN D3 (CALCIUM 600 + D,3, ORAL) Take by mouth      cholecalciferol, vitamin D3, 1,000 unit Oral Tablet Take 1,000 Units by mouth Once a day      levothyroxine (SYNTHROID) 50 mcg Oral Tablet Take 1 Tab (50 mcg total) by mouth Once a day (Patient not taking: Reported on 05/25/2014) 30 Tab 3    propranolol (INDERAL) 10 mg Oral Tablet Take 1 Tab (10 mg total) by mouth Per instructions Take one tab PO every 6 hours as needed for tremor 60 Tab 3    Sildenafil (VIAGRA) 25 mg Oral Tablet Take 1 Tab (25 mg total) by mouth Every 24 hours as needed 7 Tab 1    thyroid (ARMOUR THYROID) 60 mg Oral Tablet Take 60 mg by mouth Once a day       No current facility-administered medications for this visit.       Problem List:  Patient Active Problem List   Diagnosis    Hypothyroid    Neck pain    Elevated BP    Stenosis of rectum    History of malignant neoplasm of large intestine    Ulcerative colitis    Hyperkalemia    Hyperphosphatemia       Anemia, unspecified anemia type       Review of Systems:  Constitutional - no appetite or weight changes, no fatigue, no fevers, chills, or night sweats  Respiratory - no dyspnea or cough  Cardiovascular - no chest pain or palpitations  Gastrointestinal - no nausea, vomiting, diarrhea, or constipation, no dyspepsia  Endocrine - no heat or cold intolerance, no polyuria, polydipsia, or polyphagia  Skin - no rashes, color changes, or lesions  Musculoskeletal - no arthralgias or myalgias  Genitourinary - no urinary frequency or dysuria, no genital discharge  Neurologic - no vision or hearing changes, no weakness, no parasthesias   Psychiatric - mood has been appropriate, no depression or anxiety    Physical Examination:  Pulse 73   Temp(Src) 36.1 C (96.9 F)   Resp 20   Ht 1.829 m (6')   Wt 66.679 kg (147 lb)   BMI 19.93 kg/m2   SpO2 100%  General Appearance: AF. VSS. AAOx3. NAD.  HEENT: PERRLA/EOMI/TMs normal/ No throat erythema or drainage/MMM  Neck: Trachea midline.  No cervical lymphadenopathy. Thyroid not enlarged. No carotid bruit.  Lungs: Nonlabored with symmetric movement. CTAB/No WRR.  Heart: RRR/S1S2. No murmurs. Pulses equal bilaterally.  Abdomen: S/NT/ND/BS+/no hepatospenomegaly (+)colostomy bag RLQ  Extremities: Warm and dry. No edema. Intact strength and sensation.   Neuro: CN II-XII grossly intact, normal gait.  Psychiatric: Normal mood and affect. No SI/HI.    Results:  05/16/2014  Result Name Results Units Reference Range   CHOLESTEROL  137  mg/dL  0-199   TRIGLYCERIDES  59  mg/dL  0-199   HDL  44  mg/dL  29-71   LDL DIRECT  75  mg/dL  0-99   VLDL  12  mg/dL  0-50     Result Name Results Units Reference Range   SODIUM  138  mEq/L  136-144   POTASSIUM  5.3 H  mEq/L  3.6-5.1   CHLORIDE  103  mEq/L  101-111   CO2 TOTAL  27  mEq/L  22-32   BUN  20  mg/dL  8-20   CREATININE, SERUM  0.6  mg/dL  0.6-1.2   GLUCOSE  78  mg/dL  70-110   CALCIUM  9.7  mg/dL  8.9-10.3   BILIRUBIN, TOTAL  0.3  mg/dL  0.3-1.2    Full Term:   0.0 - 6.0 (Birth to 6 days)  Premature: 0.0 - 12.9 (Birth to 7 days)  T. Bilirubin Reference Range for Newborns: Full Term:   0.0 - 6.0 (Birth  to 6 days); Premature: 0.0 - 12.9 (Birth to 7 days)   Samples from patients who have taken Naproxen have shown spurious  elevation in Total Bilirubin levels due to metabolite  O-desmethylnaproxen interference with Jendrassik-Grof methods.   ALK. PHOSPHATASE  51  U/L  20-130   AST  16  U/L  8-33   ALT  16  U/L  4-36   PROTEIN, TOTAL  6.4  gm/dL  6.4-8.3   ALBUMIN  3.3 L  gm/dL  3.5-4.8   eGFR  >60 A   Avg. 85         Assessment and Plan:    ICD-10-CM    1. Encounter for general adult medical examination w/o abnormal findings Z00.00 Discussed healthy lifestyle choices.    Dr. Awanda Mink was present during the visit.    If anything comes up in the mean time, I advised the patient to let me know and I would be more than happy to speak with them. If it cannot wait to please go to Urgent Care or ER.   The patient was given the opportunity to ask questions and those questions were answered to the patient's satisfaction. The patient was encouraged to call with any additional questions or concerns. Instructed patient to call back if symptoms worse.     No orders of the defined types were placed in this encounter.       Education  Medication safety and fall prevention    Follow up: Return in about 6 weeks (around 08/24/2014) for Hospital follow up.        Durenda Age, MD 07/14/2014, 15:14    [facility]  [address] [place] [address]   Dept: [phone]   Dept Fax: [phone]     Supervising Physician's Statement    History/Reason for today's visit: As stated in above note.  Exam: As per resident note.  Assessment/Plan: As per resident note.  Preceptor for this encounter: Dr. Rodell Perna and prior to  the patient's discharge today, I was present in the key portions of service as it was rendered.  I discussed medical decision making  with the resident physician before the end of the encounter.    Rodell Perna, MD

## 2014-08-24 ENCOUNTER — Ambulatory Visit (INDEPENDENT_AMBULATORY_CARE_PROVIDER_SITE_OTHER): Payer: BC Managed Care – PPO | Admitting: Family Medicine

## 2014-08-24 ENCOUNTER — Encounter (INDEPENDENT_AMBULATORY_CARE_PROVIDER_SITE_OTHER): Payer: Self-pay | Admitting: Family Medicine

## 2014-08-24 VITALS — BP 138/90 | HR 69 | Temp 97.0°F | Resp 20 | Ht 71.0 in | Wt 152.0 lb

## 2014-08-24 DIAGNOSIS — E039 Hypothyroidism, unspecified: Secondary | ICD-10-CM

## 2014-08-24 DIAGNOSIS — D649 Anemia, unspecified: Secondary | ICD-10-CM

## 2014-08-24 DIAGNOSIS — Z6821 Body mass index (BMI) 21.0-21.9, adult: Secondary | ICD-10-CM

## 2014-08-24 DIAGNOSIS — Z85038 Personal history of other malignant neoplasm of large intestine: Secondary | ICD-10-CM

## 2014-08-24 MED ORDER — FERROUS SULFATE 324 MG (65 MG IRON) TABLET,DELAYED RELEASE
324.0000 mg | DELAYED_RELEASE_TABLET | Freq: Every morning | ORAL | Status: DC
Start: 2014-08-24 — End: 2015-01-04

## 2014-08-24 NOTE — Patient Instructions (Signed)
1. Continue to take medications as directed.   2. Follow-up as scheduled.   3. If any problems, call the family medicine clinic at [phone] during normal business hours, call the hospital operator after hours and ask for the resident on call for family medicine, or go to the emergency department for immediate evaluation if it is an emergency.    Patient was counseled about lifestyle modification including increasing physical activity and proper diet including balanced diet and portion control. Patient also counseled about age-appropriate health maintenance issues including obtaining regular lab work, eye exams, dental exams, screening procedures/tests, vaccination, and appropriate vitamin supplementation. Also instructed to follow-up with specialists as scheduled.

## 2014-08-25 NOTE — Progress Notes (Addendum)
[facility]  [address]  [place], [place] [address]  Phone: [phone]  Fax: [phone]          Name: [name]  MRN : [id number]  Date of service: 08/24/2014    Chief Complaint: Hypothyroid    History of Present Illness:  [name] is a 69 y.o. male who presents as an established patient to the Family Medicine clinic at [facility].  Patient comes in for follow up. Patient was seen last time for [facility] visit. Patient is doing very well. Had labs done recently and can be located in the media section.  Anemia - stable. Patient currently on ferrous sulfate. Hgb 11.3. Feels less tired.  Hyperkalemia - K is 4.4. Stable. No complaints.  Hypothyroidism - compliant with meds. Feels less tired.  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 (+)Ileostomy bag LLQ. Doing well.  Overall, patient states he feels much better and less tired. Denies CP/SOB.     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    History of malignant neoplasm of large intestine 03/05/2013    Ulcerative colitis 03/05/2013           Medications:  Current Outpatient Prescriptions   Medication Sig Dispense Refill    alendronate (FOSAMAX) 70 mg Oral Tablet Take 70 mg by mouth Every 7 days      CALCIUM CARBONATE/VITAMIN D3 (CALCIUM 600 + D,3, ORAL) Take by mouth      cholecalciferol, vitamin D3, 1,000 unit Oral Tablet Take 1,000 Units by mouth Once a day      ferrous sulfate (FERATAB) 324 mg (65 mg iron) Oral Tablet, Delayed Release (E.C.) Take 1 Tab (324 mg total) by mouth Every morning before breakfast 30 Tab 3    levothyroxine (SYNTHROID) 50 mcg Oral Tablet Take 1 Tab (50 mcg total) by mouth Once a day (Patient not taking: Reported on 05/25/2014) 30 Tab 3    loperamide (IMODIUM) 2 mg Oral Capsule Take 2 mg by mouth Every 6 hours as needed       MULTIVITAMINS WITH FLUORIDE (MULTI-VITAMIN PO) Take by mouth Once a day      propranolol (INDERAL) 10 mg Oral Tablet Take 1 Tab (10 mg total) by mouth Per instructions Take one tab PO every 6 hours as needed for tremor 60 Tab 3    Sildenafil (VIAGRA) 25 mg Oral Tablet Take 1 Tab (25 mg total) by mouth Every 24 hours as needed (Patient not taking: Reported on 08/24/2014) 7 Tab 1    thyroid (ARMOUR THYROID) 60 mg Oral Tablet Take 60 mg by mouth Once a day       No current facility-administered medications for this visit.       Problem List:  Patient Active Problem List   Diagnosis    Hypothyroid    Neck pain    Elevated BP    Stenosis of rectum    History of malignant neoplasm of large intestine    Ulcerative colitis    Hyperkalemia    Hyperphosphatemia    Anemia, unspecified anemia type       Review of Systems:  Constitutional - no appetite or weight changes, no fatigue, no fevers, chills, or night sweats  Respiratory - no dyspnea or cough  Cardiovascular -  no chest pain or palpitations  Gastrointestinal - no nausea, vomiting, diarrhea, or constipation, no dyspepsia  Endocrine - no heat or cold intolerance, no polyuria, polydipsia, or polyphagia  Skin - no rashes, color changes, or lesions  Musculoskeletal - no arthralgias or myalgias  Genitourinary - no urinary frequency or dysuria, no genital discharge  Neurologic - no vision or hearing changes, no weakness, no parasthesias   Psychiatric - mood has been appropriate, no depression or anxiety    Physical Examination:  BP 138/90 mmHg   Pulse 69   Temp(Src) 36.1 C (97 F)   Resp 20   Ht 1.803 m (5\' 11" )   Wt 68.947 kg (152 lb)   BMI 21.21 kg/m2   SpO2 99%  General Appearance: AF. VSS. AAOx3. NAD.  Neck: Trachea midline. No cervical lymphadenopathy. Thyroid not enlarged.   Lungs: Nonlabored with symmetric movement. CTAB/No WRR.  Heart: RRR/S1S2. No murmurs. Pulses equal bilaterally.  Abdomen: S/NT/ND/BS+/no hepatospenomegaly. (+)ileostomy LLQ  Extremities:  Warm and dry. No edema. Intact strength and sensation.   Neuro: CN II-XII grossly intact, normal gait.  Psychiatric: Normal mood and affect. No SI/HI.    Results:  Results found in Environmental health practitioner.    Assessment and Plan:    ICD-10-CM    1. Anemia, unspecified anemia type D64.9 Continue ferrous sulfate (FERATAB) 324 mg (65 mg iron) Oral Tablet, Delayed Release (E.C.). Hgb improved.   2. Hypothyroidism, unspecified hypothyroidism type E03.9 Stable. Dr. Tonye Royalty following. Patient currently on Armour thyroid replacement. He can't tolerate Synthroid.   3. History of malignant neoplasm of large intestine Z85.038 Stable. Will continue to monitor.       If anything comes up in the mean time, I advised the patient to let me know and I would be more than happy to speak with them. If it cannot wait to please go to Urgent Care or ER.   The patient was given the opportunity to ask questions and those questions were answered to the patient's satisfaction. The patient was encouraged to call with any additional questions or concerns. Instructed patient to call back if symptoms worse.     Orders Placed This Encounter    ferrous sulfate (FERATAB) 324 mg (65 mg iron) Oral Tablet, Delayed Release (E.C.)       Education  Medication safety    Follow up:  Return in about 6 months (around 02/24/2015) for anemia or sooner if needed.      Durenda Age, MD 08/25/2014, 18:07    [facility]  [address] [place] [address]   Dept: [phone]   Dept Fax: [phone]   ___________________________________________________    History/Reason for today's visit: As stated in above note.    Exam: As per resident note.    Assessment/Plan: As per resident note.      Preceptor for this encounter: Maleaha Hughett M.D. and prior to or immediately following the patient's discharge today, I reviewed and concur with the history, examination, tests ordered, diagnosis, and plan of the resident physician.    Wreatha Sturgeon A.  [place]   [address] [place] [address]  Dept: [phone]  Dept Fax: [phone]

## 2014-08-30 ENCOUNTER — Other Ambulatory Visit (INDEPENDENT_AMBULATORY_CARE_PROVIDER_SITE_OTHER): Payer: Self-pay

## 2014-09-13 ENCOUNTER — Other Ambulatory Visit (INDEPENDENT_AMBULATORY_CARE_PROVIDER_SITE_OTHER): Payer: Self-pay

## 2014-09-23 ENCOUNTER — Encounter (INDEPENDENT_AMBULATORY_CARE_PROVIDER_SITE_OTHER): Payer: BC Managed Care – PPO | Admitting: Surgery

## 2014-09-23 DIAGNOSIS — K469 Unspecified abdominal hernia without obstruction or gangrene: Secondary | ICD-10-CM

## 2014-11-24 ENCOUNTER — Encounter (INDEPENDENT_AMBULATORY_CARE_PROVIDER_SITE_OTHER): Payer: Self-pay | Admitting: Family

## 2014-11-30 ENCOUNTER — Encounter (INDEPENDENT_AMBULATORY_CARE_PROVIDER_SITE_OTHER): Payer: Self-pay | Admitting: Family

## 2014-12-01 ENCOUNTER — Emergency Department (EMERGENCY_DEPARTMENT_HOSPITAL): Payer: BC Managed Care – PPO

## 2014-12-01 ENCOUNTER — Emergency Department (EMERGENCY_DEPARTMENT_HOSPITAL): Admission: EM | Admit: 2014-12-01 | Discharge: 2014-12-01 | Payer: BC Managed Care – PPO

## 2014-12-01 DIAGNOSIS — E039 Hypothyroidism, unspecified: Secondary | ICD-10-CM

## 2014-12-01 DIAGNOSIS — Z933 Colostomy status: Secondary | ICD-10-CM

## 2014-12-01 DIAGNOSIS — K219 Gastro-esophageal reflux disease without esophagitis: Secondary | ICD-10-CM

## 2014-12-01 DIAGNOSIS — K5660 Unspecified intestinal obstruction: Secondary | ICD-10-CM

## 2014-12-01 DIAGNOSIS — N2889 Other specified disorders of kidney and ureter: Secondary | ICD-10-CM

## 2014-12-01 DIAGNOSIS — Z85038 Personal history of other malignant neoplasm of large intestine: Secondary | ICD-10-CM

## 2014-12-01 DIAGNOSIS — R109 Unspecified abdominal pain: Secondary | ICD-10-CM

## 2014-12-05 ENCOUNTER — Inpatient Hospital Stay (INDEPENDENT_AMBULATORY_CARE_PROVIDER_SITE_OTHER): Payer: BC Managed Care – PPO | Admitting: Family Medicine

## 2014-12-28 ENCOUNTER — Ambulatory Visit (INDEPENDENT_AMBULATORY_CARE_PROVIDER_SITE_OTHER): Payer: BC Managed Care – PPO | Admitting: Surgery

## 2014-12-28 ENCOUNTER — Encounter (INDEPENDENT_AMBULATORY_CARE_PROVIDER_SITE_OTHER): Payer: Self-pay | Admitting: Surgery

## 2014-12-28 VITALS — BP 138/84 | Ht 71.0 in | Wt 160.0 lb

## 2014-12-28 DIAGNOSIS — K56609 Unspecified intestinal obstruction, unspecified as to partial versus complete obstruction: Secondary | ICD-10-CM

## 2014-12-28 DIAGNOSIS — K5669 Other intestinal obstruction: Secondary | ICD-10-CM

## 2014-12-28 NOTE — Progress Notes (Addendum)
[facility] History & Physical    Patient: [name]  D.O.B.: 05-01-1946  MRN# [id number]  Date of Service: 12/28/2014    HPI  [name] is a 69 y.o. male who was seen today for Hospital Follow Up. He states he is feeling well. He quit taking lomotil. He is still having loose stool. Denies constipation.     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    History of malignant neoplasm of large intestine 03/05/2013    Ulcerative colitis 03/05/2013          Past Surgical History   Procedure Laterality Date    Colonoscopy      Hx small bowel resection  2010    Hx other  2015     Proctolectomy           Current Outpatient Prescriptions   Medication Sig    alendronate (FOSAMAX) 70 mg Oral Tablet Take 70 mg by mouth Every 7 days    CALCIUM CARBONATE/VITAMIN D3 (CALCIUM 600 + D,3, ORAL) Take by mouth    cholecalciferol, vitamin D3, 1,000 unit Oral Tablet Take 1,000 Units by mouth Once a day    ferrous sulfate (FERATAB) 324 mg (65 mg iron) Oral Tablet, Delayed Release (E.C.) Take 1 Tab (324 mg total) by mouth Every morning before breakfast    levothyroxine (SYNTHROID) 50 mcg Oral Tablet Take 1 Tab (50 mcg total) by mouth Once a day (Patient not taking: Reported on 05/25/2014)    loperamide (IMODIUM) 2 mg Oral Capsule Take 2 mg by mouth Every 6 hours as needed    MULTIVITAMINS WITH FLUORIDE (MULTI-VITAMIN PO) Take by mouth Once a day    propranolol (INDERAL) 10 mg Oral Tablet Take 1 Tab (10 mg total) by mouth Per instructions Take one tab PO every 6 hours as needed for tremor    Sildenafil (VIAGRA) 25 mg Oral Tablet Take 1 Tab (25 mg total) by mouth Every 24 hours as needed (Patient not taking: Reported on 08/24/2014)    thyroid (ARMOUR THYROID) 60 mg Oral Tablet Take 60 mg by mouth Once a day     No Known Allergies   History   Substance Use Topics    Smoking status: Never Smoker     Smokeless  tobacco: Never Used    Alcohol Use: Not on file     History   Drug Use Not on file      No family history on file.          ROS:  General: Denies fever, chills, or any changes in weight.  EENT: Denies blurry vision, hearing loss.  Cardiovascular: Denies chest pain or palpitations.  Respiratory: Denies SOB, cough.  Gastrointestinal: Denies abdominal pain, N/V, constipation, or diarrhea. Denies any change in bowel movements or blood in stool.  GU: Denies dysuria, change in urinary habits, or hematuria.  Musculoskeletal: Denies weakness or joint pain. Denies trouble moving extremities.  Neurological: Denies HA, dizziness.  Hematologic: Denies hx of bleeding disorders or blood clots.  Psychiatric: Denies changes in mood, anxiety, or depression.    Objective:  BP 138/84 mmHg   Ht 1.803 m (5\' 11" )   Wt 72.576 kg (160 lb)   BMI 22.33 kg/m2    Physical Exam:  Constitutional:  Alert, healthy, well nourished, no acute distress and not  in pain.      Abdomen: Abdomen soft and supple; No tenderness. Non-distended; No masses present. Bowel sounds normal and active in all 4 quadrants.  Psychiatric: Normal mood and affect; Judgement and thought content normal.      Assessment:  Small bowel obstruction    Plan:   Orders Placed This Encounter   Procedures    FLUORO UPPER GI W SBFT (AIR)     Standing Status: Future      Number of Occurrences: 1      Standing Expiration Date: 03/29/2016     Order Specific Question:  Ordering Provider     Answer:  Nissan Frazzini [[id number]]     He was advised to use Lomotil as needed.   Return if symptoms worsen or fail to improve.    We discussed the surgery at length including risks, benefits, and alternatives.  Patient verbalized understanding and consent obtained.     He was given the opportunity to ask questions and those questions were appropriately answered. He agreed with the treatment plan and is encouraged to call with any additional questions or concerns.       Isabella Bowens, SCRIBE     I am  scribing for, and in the presence of Dr. Marguerita Beards for services provided on 12/28/2014.  Isabella Bowens, SCRIBE     I personally performed the services described in this documentation, as scribed  in my presence, and it is both accurate  and complete.    Marguerita Beards, MD

## 2015-01-04 ENCOUNTER — Other Ambulatory Visit (INDEPENDENT_AMBULATORY_CARE_PROVIDER_SITE_OTHER): Payer: Self-pay | Admitting: Family Medicine

## 2015-01-04 DIAGNOSIS — D649 Anemia, unspecified: Secondary | ICD-10-CM

## 2015-01-04 MED ORDER — FERROUS SULFATE 324 MG (65 MG IRON) TABLET,DELAYED RELEASE
324.0000 mg | DELAYED_RELEASE_TABLET | Freq: Every morning | ORAL | Status: DC
Start: 2015-01-04 — End: 2016-03-22

## 2015-01-10 ENCOUNTER — Other Ambulatory Visit (INDEPENDENT_AMBULATORY_CARE_PROVIDER_SITE_OTHER): Payer: Self-pay | Admitting: Surgery

## 2015-08-09 ENCOUNTER — Ambulatory Visit (INDEPENDENT_AMBULATORY_CARE_PROVIDER_SITE_OTHER): Payer: BC Managed Care – PPO | Admitting: Family Medicine

## 2015-08-09 ENCOUNTER — Encounter (INDEPENDENT_AMBULATORY_CARE_PROVIDER_SITE_OTHER): Payer: Self-pay | Admitting: Family Medicine

## 2015-08-09 VITALS — BP 134/90 | HR 64 | Temp 97.9°F | Resp 18 | Ht 71.0 in | Wt 171.6 lb

## 2015-08-09 DIAGNOSIS — Z6823 Body mass index (BMI) 23.0-23.9, adult: Secondary | ICD-10-CM

## 2015-08-09 DIAGNOSIS — I10 Essential (primary) hypertension: Secondary | ICD-10-CM

## 2015-08-09 DIAGNOSIS — IMO0001 Reserved for inherently not codable concepts without codable children: Secondary | ICD-10-CM

## 2015-08-09 DIAGNOSIS — G4700 Insomnia, unspecified: Secondary | ICD-10-CM

## 2015-08-09 DIAGNOSIS — R030 Elevated blood-pressure reading, without diagnosis of hypertension: Principal | ICD-10-CM

## 2015-08-09 MED ORDER — TRAZODONE 50 MG TABLET
25.00 mg | ORAL_TABLET | Freq: Every evening | ORAL | 5 refills | Status: DC | PRN
Start: 2015-08-09 — End: 2016-03-22

## 2015-08-09 MED ORDER — AMLODIPINE 2.5 MG TABLET
2.5000 mg | ORAL_TABLET | Freq: Every day | ORAL | 5 refills | Status: DC
Start: 2015-08-09 — End: 2016-02-05

## 2015-08-09 NOTE — Progress Notes (Addendum)
[facility]  [address] [place] [address]  Dept: [phone]  Dept Fax: [phone]    [name]  Date of Service: 08/09/2015    Chief complaint:   Chief Complaint   Patient presents with    Elevated Blood Pressure       HPI:  [name] is a 70 y.o. patient of Dr. Geraldo Pitter past medical history of ulcerative colitis, history of stage I colon cancer, and hypothyroidism who is here for an acute visit after noticing his blood pressure has been elevated at home. Systolic blood pressure has been running somewhere between 130 and [id number] with diastolic running around 90. He is never been on blood pressure medication in the past. He does have family history of cardiac disease and high blood pressure and is concerned that this may be leading to an increased cardiovascular risk. Recent labs done at outside facility did not show any major abnormalities including normal thyroid function kidneys, and electrolytes. He does note occasional headaches that resolve on their own. No dizziness or vision changes. No fever, chills, nausea, diarrhea, shortness of breath, chest pain.    He is also being given Valium to use as needed to help with sleep through his pulmonologist. He does note feeling groggy the next day after taking it. He takes it infrequently.    Review of Systems:  All other systems reviewed and negative except as stated above.    Problem List:  Patient Active Problem List   Diagnosis    Hypothyroid    Neck pain    Elevated BP    Stenosis of rectum    History of malignant neoplasm of large intestine    Ulcerative colitis (HCC)    Hyperkalemia    Hyperphosphatemia    Anemia, unspecified anemia type       Medications:  Current Outpatient Prescriptions   Medication Sig    alendronate (FOSAMAX) 70 mg Oral Tablet Take 70 mg by mouth Every 7 days Reported on 08/09/2015    CALCIUM CARBONATE/VITAMIN D3 (CALCIUM 600 + D,3, ORAL) Take by mouth    cholecalciferol, vitamin D3, 1,000  unit Oral Tablet Take 1,000 Units by mouth Once a day    ferrous sulfate (FERATAB) 324 mg (65 mg iron) Oral Tablet, Delayed Release (E.C.) Take 1 Tab (324 mg total) by mouth Every morning before breakfast (Patient not taking: Reported on 08/09/2015)    levothyroxine (SYNTHROID) 50 mcg Oral Tablet Take 1 Tab (50 mcg total) by mouth Once a day (Patient not taking: Reported on 05/25/2014)    loperamide (IMODIUM) 2 mg Oral Capsule Take 2 mg by mouth Every 6 hours as needed    MULTIVITAMINS WITH FLUORIDE (MULTI-VITAMIN PO) Take by mouth Once a day    propranolol (INDERAL) 10 mg Oral Tablet Take 1 Tab (10 mg total) by mouth Per instructions Take one tab PO every 6 hours as needed for tremor (Patient not taking: Reported on 08/09/2015)    Sildenafil (VIAGRA) 25 mg Oral Tablet Take 1 Tab (25 mg total) by mouth Every 24 hours as needed (Patient not taking: Reported on 08/24/2014)    thyroid (ARMOUR THYROID) 60 mg Oral Tablet Take 60 mg by mouth Once a day       Allergies:  No Known Allergies    Past History: Medical, surgical, family, and social history reviewed with patient and updated in EMR at this visit.    Objective:  Visit Vitals  BP 134/90    Pulse 64    Temp 36.6 C (97.9 F)    Resp 18    Ht 1.803 m (5\' 11" )    Wt 77.8 kg (171 lb 9.6 oz)    SpO2 97%    BMI 23.93 kg/m2     GEN: Alert, cooperative, no distress, appears stated age. Patient seen on exam table answering questions appropriately.  HEAD: Normocephalic, atraumatic   EYES: EOMI, conjunctivae clear  NOSE/THROAT: no nasal discharge, non-erythematous; lips, mucosa and tongue normal  NECK: no cervical/supraclavicular lymphadenopathy, trachea midline  BACK: symmetrical, ROM normal  LUNGS: b/l CTA, no wheezes, no rhonchi, no crackles  HEART: s1 and s2 present, regular rate and rhythm, no murmurs  ABDOMEN: soft, non-tender, bowel sounds present, no masses, no abdominal bruits  EXTREMITIES: atraumatic, trace edema  NEURO: grossly normal  SKIN: no rashes  or lesions  PSYCH: Normal affect and mood, No SI, No HI    Assessment and Plan:  1. Elevated BP  Given the patient has no prior cardiovascular disease or diabetes, and the fact that his age is over 59, our goal blood pressure should be to remain under 150/90. Diastolic seems to be above goal and patient is willing to take a blood pressure medication. We will start her on the lowest dose of Norvasc and titrate up as needed. He is to take his blood pressure at home and call me if it is still remaining high. Side effects of the medication were discussed.  - amLODIPine (NORVASC) 2.5 mg Oral Tablet; Take 1 Tab (2.5 mg total) by mouth Once a day  Dispense: 30 Tab; Refill: 5    2. Insomnia, unspecified type  Valium likely to last in the system for a much longer time than needed for help with sleep. Furthermore as the patient ages it is likely to last even longer given changes in metabolism of the drug. I suggested we try something different and less potent. Patient is open to trying trazodone. Side effects discussed. We will start with 25 mg daily and increase if needed.  - traZODone (DESYREL) 50 mg Oral Tablet; Take 0.5 Tabs (25 mg total) by mouth Every night as needed for Insomnia  Dispense: 15 Tab; Refill: 5    The patient was given ample opportunity to ask questions and those questions were answered to the patient's satisfaction. The patient was encouraged to be involved in their own care, and all diagnoses, medications, and medication side-effects were discussed. The patient was told to contact me with any additional questions or concerns, or go to the ED in an emergency.     Follow up: Return in about 2 months (around 10/09/2015) for hypertension.      Sallye Lat, M.D. 08/09/2015 10:51      [address] [place] [address]  Dept: [phone]  Dept Fax: [phone]    Supervising Physician's Statement    History/Reason for today's visit: As stated in above note.  Exam: As per resident  note.  Assessment/Plan: As per resident note.  Preceptor for this encounter: Dr. Rodell Perna and prior to or immediately following the patient's discharge today, I reviewed and concur with the history, examination, tests ordered, diagnosis, and plan of the resident physician.    Rodell Perna, MD

## 2016-02-05 ENCOUNTER — Other Ambulatory Visit (HOSPITAL_BASED_OUTPATIENT_CLINIC_OR_DEPARTMENT_OTHER): Payer: Self-pay | Admitting: Family Medicine

## 2016-02-05 MED ORDER — AMLODIPINE 2.5 MG TABLET
2.5000 mg | ORAL_TABLET | Freq: Every day | ORAL | 5 refills | Status: DC
Start: 2016-02-05 — End: 2016-08-12

## 2016-03-05 ENCOUNTER — Ambulatory Visit: Payer: BC Managed Care – PPO | Attending: Nurse Practitioner

## 2016-03-05 DIAGNOSIS — Z026 Encounter for examination for insurance purposes: Secondary | ICD-10-CM | POA: Insufficient documentation

## 2016-03-05 LAB — LIPID PANEL
CHOLESTEROL: 176 mg/dL (ref 0–199)
HDL CHOL: 50 mg/dL (ref 29–71)
LDL DIRECT: 105 mg/dL — ABNORMAL HIGH (ref 0–99)
TRIGLYCERIDES: 56 mg/dL (ref 0–199)
TRIGLYCERIDES: 56 mg/dL (ref 0–199)
VLDL CALC: 11 mg/dL (ref 0–50)

## 2016-03-05 LAB — GLUCOSE FASTING: GLUCOSE FASTING: 102 mg/dL (ref 70–110)

## 2016-03-22 ENCOUNTER — Encounter (HOSPITAL_BASED_OUTPATIENT_CLINIC_OR_DEPARTMENT_OTHER): Payer: Self-pay | Admitting: Family Medicine

## 2016-03-22 ENCOUNTER — Ambulatory Visit: Payer: BC Managed Care – PPO | Admitting: Family Medicine

## 2016-03-22 VITALS — BP 126/80 | HR 67 | Temp 97.6°F | Resp 20 | Ht 71.0 in | Wt 171.6 lb

## 2016-03-22 DIAGNOSIS — Z6823 Body mass index (BMI) 23.0-23.9, adult: Secondary | ICD-10-CM

## 2016-03-22 DIAGNOSIS — Z0000 Encounter for general adult medical examination without abnormal findings: Principal | ICD-10-CM

## 2016-03-22 NOTE — Progress Notes (Signed)
[facility]  [id number]  [place], [place] [address]  Phone: [phone]  Fax: [phone]      Name: [name]  MRN : [id number]  Date of service: 03/22/2016 at  8:00 AM EDT    CHIEF COMPLAINT:  Annual Physical    HISTORY OF PRESENT ILLNESS:   [name] is a 70 y.o. male who is an established patient with [facility]; he comes in today for annual physical exam. He has history of ulcerative colitis and colon cancer s/p resection in 2010 and ileostomy in 2015 by Dr. Rowe Clack. Patient follows with Dr. Tonye Royalty for hypothyroidism, and Dr. Genene Churn for pulmonary fibrosis. Patient has lab work completed. He states he is feeling well overall with no complaints. Vaccinations up to date.     Review of Systems:  Constitutional- denies fevers, chills, unintentional weight loss or gain, fatigue  HEENT- denies headache, change in vision, nasal drainage or congestion, sore throat  Respiratory- denies cough, shortness of breath, wheezing  Cardiovascular- denies chest pain, palpitations, leg swelling  Gastrointestinal- denies abdominal pain, nausea, vomiting, diarrhea, constipation  Endocrine - denies heat or cold intolerance, polyuria, polydipsia, or polyphagia  Genitourinary- denies dysuria, urinary frequency or urgency,   Musculoskeletal- denies back pain, neck pain, joint pain  Neurological- denies numbness, tingling, headaches, dizziness, fainting, seizures   Skin - denies rashes or lesions  Psychiatric- denies depression, anxiety and insomnia, SI, HI, substance abuse    PAST MEDICAL HISTORY:  Past Medical History:   Diagnosis Date    H/O colon cancer, stage I     History of malignant neoplasm of large intestine 03/05/2013    Hypothyroid     Osteoporosis     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    Ulcerative colitis ([place]) 03/05/2013     MEDICATIONS:  Current Outpatient Prescriptions   Medication Sig    amLODIPine (NORVASC) 2.5 mg Oral Tablet Take 1 Tab (2.5 mg total) by  mouth Once a day    CALCIUM CARBONATE/VITAMIN D3 (CALCIUM 600 + D,3, ORAL) Take by mouth    cholecalciferol, vitamin D3, 1,000 unit Oral Tablet Take 1,000 Units by mouth Once a day    levothyroxine (SYNTHROID) 50 mcg Oral Tablet Take 1 Tab (50 mcg total) by mouth Once a day (Patient not taking: Reported on 05/25/2014)    loperamide (IMODIUM) 2 mg Oral Capsule Take 2 mg by mouth Every 6 hours as needed    MULTIVITAMINS WITH FLUORIDE (MULTI-VITAMIN PO) Take by mouth Once a day    propranolol (INDERAL) 10 mg Oral Tablet Take 1 Tab (10 mg total) by mouth Per instructions Take one tab PO every 6 hours as needed for tremor (Patient not taking: Reported on 08/09/2015)    Sildenafil (VIAGRA) 25 mg Oral Tablet Take 1 Tab (25 mg total) by mouth Every 24 hours as needed    thyroid (ARMOUR THYROID) 60 mg Oral Tablet Take 60 mg by mouth Once a day     ALLERGIES:  No Known Allergies    PHYSICAL EXAM:  BP 126/80   Pulse 67   Temp 36.4 C (97.6 F) (Thermal Scan)    Resp 20   Ht 1.803 m (5\' 11" )   Wt 77.8 kg (171 lb 9.6 oz)   SpO2 94%   BMI 23.93 kg/m2   GEN: cooperative, no distress, appears stated age. Patient seen on exam table answering questions appropriately.  PSYCH: Normal affect and mood, No SI, No HI  HEENT: NC/AT, PERRLA B/L, EOMI B/L, conjunctivae clear, no nasal discharge, non-erythematous; lips, mucosa and tongue normal  NECK: no cervical/supraclavicular lymphadenopathy, trachea midline  BACK: symmetrical, ROM normal  LUNGS: CTA B/L, no wheezes, no crackles, no rhonch  HEART: Regular rate and rhythm, S1 and S2 normal, no murmurs  ABDOMEN: soft, non-tender, non-distended, bowel sounds present, no organomegaly  EXTREMITIES: atraumatic, no cyanosis, no edema  NEURO: Alert and oriented x3, grossly normal, no focal deficits observed  SKIN: no rashes or lesions    LABS:    03/05/2016 07:54   GLUCOSE, FASTING 102   CHOLESTEROL 176   HDL-CHOLESTEROL 50   LDL CHOLESTEROL,DIRECT 105 (H)   TRIGLYCERIDES 56   VLDL  (CALCULATED) 11     ASSESSMENT AND PLAN: Patient was counseled about lifestyle modification including increasing physical activity and proper diet including balanced diet and portion control. Patient also counseled about age-appropriate health maintenance issues including obtaining regular lab work, eye exams, dental exams, screening procedures/tests, vaccination, and appropriate vitamin supplementation. Also instructed to follow-up with specialists as scheduled.     ICD-10-CM    1. Annual physical exam Z00.00      No orders of the defined types were placed in this encounter.    FOLLOW UP:  Return in about 6 months (around 09/20/2016) for f/u.    The patient was given ample opportunity to ask questions and those questions were answered to the patient's satisfaction. The patient was encouraged to be involved in their own care, and all diagnoses, medications, and medication side-effects were discussed. The patient was told to contact me with any additional questions or concerns, and to go to the emergency department in an emergency.     Patient was seen with Dr. Elliot Dally present in the room for this encounter.     Jenel Lucks, M.D. 03/22/2016, 10:51     [facility]  [address] [place] [address]   Phone: [phone]   Fax: [phone]            ___________________________________________________    History/Reason for today's visit: As stated in above note.    Exam: As per resident note.    Assessment/Plan: As per resident note.      Preceptor for this encounter: Tillie Rung. Neeta Storey, D.O. and prior to the patient's discharge today, I was present for the key portions of service as it was rendered.  I discussed medical decision making with the resident physician before the end of the encounter.    Tillie Rung. Larose Batres, [place]   [address]  [place] [address]  Dept: [phone]  Dept Fax: [phone]

## 2016-04-06 ENCOUNTER — Emergency Department: Payer: BC Managed Care – PPO

## 2016-04-06 ENCOUNTER — Encounter (HOSPITAL_COMMUNITY): Payer: Self-pay

## 2016-04-06 ENCOUNTER — Inpatient Hospital Stay (HOSPITAL_COMMUNITY): Payer: BC Managed Care – PPO | Admitting: Family Medicine

## 2016-04-06 ENCOUNTER — Emergency Department (HOSPITAL_COMMUNITY): Payer: BC Managed Care – PPO

## 2016-04-06 ENCOUNTER — Inpatient Hospital Stay
Admission: EM | Admit: 2016-04-06 | Discharge: 2016-04-09 | DRG: 395 | Disposition: A | Discharge status: Home / Self Care | Payer: BC Managed Care – PPO | Attending: FAMILY PRACTICE | Admitting: FAMILY PRACTICE

## 2016-04-06 DIAGNOSIS — D509 Iron deficiency anemia, unspecified: Secondary | ICD-10-CM | POA: Diagnosis present

## 2016-04-06 DIAGNOSIS — E039 Hypothyroidism, unspecified: Secondary | ICD-10-CM

## 2016-04-06 DIAGNOSIS — K433 Parastomal hernia with obstruction, without gangrene: Principal | ICD-10-CM | POA: Diagnosis present

## 2016-04-06 DIAGNOSIS — I10 Essential (primary) hypertension: Secondary | ICD-10-CM

## 2016-04-06 DIAGNOSIS — M810 Age-related osteoporosis without current pathological fracture: Secondary | ICD-10-CM | POA: Diagnosis present

## 2016-04-06 DIAGNOSIS — K56609 Unspecified intestinal obstruction, unspecified as to partial versus complete obstruction: Secondary | ICD-10-CM

## 2016-04-06 DIAGNOSIS — Z85038 Personal history of other malignant neoplasm of large intestine: Secondary | ICD-10-CM

## 2016-04-06 HISTORY — DX: Malignant neoplasm of colon, unspecified (CMS HCC): C18.9

## 2016-04-06 LAB — CBC WITH DIFF
BASOPHIL #: 0 x10ˆ3/uL (ref 0.00–0.20)
BASOPHIL %: 0 %
EOSINOPHIL #: 0.1 x10ˆ3/uL (ref 0.00–0.50)
EOSINOPHIL %: 1 %
HCT: 46.5 % (ref 38.9–50.5)
HGB: 15.8 g/dL (ref 13.4–17.3)
LYMPHOCYTE #: 1.2 x10ˆ3/uL (ref 0.80–3.20)
LYMPHOCYTE %: 13 %
MCH: 32.8 pg (ref 27.9–33.1)
MCHC: 34 g/dL (ref 32.8–36.0)
MCV: 96.6 fL — ABNORMAL HIGH (ref 82.4–95.0)
MONOCYTE #: 0.7 x10ˆ3/uL (ref 0.20–0.80)
MONOCYTE %: 8 %
MPV: 7.8 fL (ref 6.0–10.2)
NEUTROPHIL #: 7.5 x10ˆ3/uL — ABNORMAL HIGH (ref 1.60–5.50)
NEUTROPHIL %: 78 %
PLATELETS: 206 x10ˆ3/uL (ref 140–440)
RBC: 4.81 x10ˆ6/uL (ref 4.40–5.68)
RDW: 14.1 % (ref 10.9–15.1)
WBC: 9.5 x10ˆ3/uL — ABNORMAL HIGH (ref 3.3–9.3)

## 2016-04-06 LAB — BASIC METABOLIC PANEL
ANION GAP: 4 mmol/L
BUN/CREA RATIO: 19
BUN: 13 mg/dL (ref 8–20)
CALCIUM: 9.4 mg/dL (ref 8.9–10.3)
CALCIUM: 9.4 mg/dL (ref 8.9–10.3)
CHLORIDE: 103 mmol/L (ref 101–111)
CO2 TOTAL: 29 mmol/L (ref 22–32)
CREATININE: 0.67 mg/dL (ref 0.6–1.2)
ESTIMATED GFR: 117 mL/min/{1.73_m2}
ESTIMATED GFR: 117 mL/min/{1.73_m2}
GLUCOSE: 100 mg/dL (ref 70–110)
POTASSIUM: 3.8 mmol/L (ref 3.6–5.1)
SODIUM: 136 mmol/L (ref 136–144)
SODIUM: 136 mmol/L (ref 136–144)

## 2016-04-06 LAB — HEPATIC FUNCTION PANEL
ALBUMIN: 4 g/dL (ref 3.5–4.8)
ALKALINE PHOSPHATASE: 41 U/L (ref 20–130)
ALT (SGPT): 22 U/L (ref 7–42)
AST (SGOT): 22 U/L (ref 12–35)
BILIRUBIN DIRECT: 0.2 mg/dL (ref 0.1–0.5)
BILIRUBIN TOTAL: 1.2 mg/dL (ref 0.3–1.2)
PROTEIN TOTAL: 6.4 g/dL (ref 6.4–8.3)

## 2016-04-06 LAB — LIPASE: LIPASE: 31 U/L (ref 22–51)

## 2016-04-06 MED ORDER — LORAZEPAM 2 MG/ML INJECTION SOLUTION
1.00 mg | INTRAMUSCULAR | Status: AC
Start: 2016-04-07 — End: 2016-04-06
  Administered 2016-04-06: 1 mg via INTRAVENOUS
  Filled 2016-04-06: qty 1

## 2016-04-06 MED ORDER — IOVERSOL 320 MG IODINE/ML INTRAVENOUS SOLUTION
120.00 mL | INTRAVENOUS | Status: AC
Start: 2016-04-06 — End: 2016-04-06
  Administered 2016-04-06: 120 mL via INTRAVENOUS

## 2016-04-06 MED ORDER — SODIUM CHLORIDE 0.9 % INJECTION SOLUTION
10.00 mL | INTRAMUSCULAR | Status: DC
Start: 2016-04-06 — End: 2016-04-09

## 2016-04-06 NOTE — ED Provider Notes (Signed)
Department of Emergency [place]  HPI - 04/06/2016         Patient is a 70 y.o.  male presenting to the ED with chief complaint of abdominal cramping. He reports that he thinks he has a clogged Ileostomy. He reports gas output. He states that he has been eating and drinking well. His last PO was this morning. He states his Ileostomy was placed due to colon cancer. He had similar sxs 1 year ago and reported to the ED. Denies allo there complaints at this time.   .   Location: Abdomen  Quality: unspecified  Onset: unspecified  Severity: unspecified  Timing: constant  Context: unspecified  Modifying factors: None  Associated symptoms: None    Review of Systems:    Constitutional: No fever, chills  Skin: No rashes or lesions  HENT: No sore throat, ear pain, or difficulty swallowing  Eyes: No vision changes, redness, discharge  Cardio: No chest pain, palpitations   Respiratory: No cough, wheezing or SOB  GI:  No nausea or vomiting. No diarrhea or constipation. + abdominal cramping  GU:  No dysuria, hematuria, polyuria  MSK: No joint pain.  No neck or back pain  Neuro: No numbness, tingling, or weakness.  No headache  Psych: No SI or HI. Normal mood    All other symptoms reviewed and are negative, unless commented on in the HPI.     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    Above history reviewed with patient.    Allergies, medication list, and old records also reviewed.     Filed Vitals:    04/06/16 1952 04/06/16 2030 04/06/16 2115 04/06/16 2228   BP: (!) 186/105 (!) 169/104 (!) 156/91 (!) 154/92   Pulse: 75 82 81 77   Resp: 17 18 16 16    Temp: 37.2 C (99 F)      SpO2: 99% 97% 98% 98%       Physical Exam:     Nursing note and vitals reviewed.  Vital signs reviewed as above. No acute distress.   Constitutional: Pt is well-developed and well-nourished.   Head: Normocephalic and atraumatic.   Eyes: Conjunctivae are normal. Pupils are equal, round, and reactive to light. EOM are intact  Neck: Soft, supple, full range of motion.  Cardiovascular: RRR.  No Murmurs/rubs/gallops. Distal pulses present and equal bilaterally.  Pulmonary/Chest: Normal BS BL with no distress. No audible wheezes or crackles are noted.  Abdominal: Soft, nontender, nondistended.  No rebound, guarding, or masses. Post OP changes to abdomen.  Easily reducible herniation around ileostomy. Ileostomy appliance in place.  Musculoskeletal: Normal range of motion. No deformities.  Exhibits no edema and no tenderness.   Neurological: CNs 2-12 grossly intact.  No focal deficits noted.  Skin: Warm and dry. No rash or lesions  Psychiatric: Patient has a normal mood and affect.     Workup:     Labs:  Results for orders placed or performed during the hospital encounter of 04/06/16 (from the past 24 hour(s))   CBC/DIFF    Narrative    The following orders were created for panel order CBC/DIFF.  Procedure                               Abnormality         Status                     ---------                               -----------         ------                     CBC WITH [id number]                Abnormal            Final result                 Please view results for these tests on the individual orders.   BASIC METABOLIC PANEL   Result Value Ref Range    SODIUM 136 136 - 144 mmol/L    POTASSIUM 3.8 3.6 - 5.1  mmol/L    CHLORIDE 103 101 - 111 mmol/L    CO2 TOTAL 29 22 - 32 mmol/L    ANION GAP 4 mmol/L    CALCIUM 9.4 8.9 - 10.3 mg/dL    GLUCOSE 100 70 - 110 mg/dL    BUN 13 8-20 mg/dL mg/dL    CREATININE 0.67 0.6-1.2 mg/dL mg/dL    BUN/CREA RATIO 19     ESTIMATED GFR 117 Avg: 75 mL/min/1.104m2   HEPATIC FUNCTION PANEL   Result Value Ref Range    ALBUMIN 4.0 3.5 - 4.8 g/dL    ALKALINE PHOSPHATASE 41 20 - 130 U/L    ALT (SGPT) 22 7 - 42 U/L    AST (SGOT) 22 12 - 35 U/L    BILIRUBIN TOTAL 1.2 0.3 - 1.2 mg/dL    BILIRUBIN DIRECT 0.2 0.1 - 0.5 mg/dL    PROTEIN TOTAL 6.4 6.4 - 8.3 g/dL   LIPASE   Result Value Ref Range    LIPASE 31 22 - 51 U/L   CBC WITH DIFF   Result Value Ref Range    WBC 9.5 (H) 3.3 - 9.3 x103/uL    RBC 4.81 4.40 - 5.68 x106/uL    HGB 15.8 13.4 - 17.3 g/dL    HCT 46.5 38.9 - 50.5 %    MCV 96.6 (H) 82.4 - 95.0 fL    MCH 32.8 27.9 - 33.1 pg    MCHC 34.0 32.8 - 36.0 g/dL    RDW 14.1 10.9 - 15.1 %    PLATELETS 206 140 - 440 x103/uL    MPV 7.8 6.0 - 10.2 fL    NEUTROPHIL % 78 %    LYMPHOCYTE % 13 %    MONOCYTE % 8 %  EOSINOPHIL % 1 %    BASOPHIL % 0 %    NEUTROPHIL # 7.50 (H) 1.60 - 5.50 x103/uL    LYMPHOCYTE # 1.20 0.80 - 3.20 x103/uL    MONOCYTE # 0.70 0.20 - 0.80 x103/uL    EOSINOPHIL # 0.10 0.00 - 0.50 x103/uL    BASOPHIL # 0.00 0.00 - 0.20 x103/uL       Imaging:    Results for orders placed or performed during the hospital encounter of 04/06/16 (from the past 72 hour(s))   CT ABDOMEN PELVIS W IV CONTRAST     Status: None    Narrative    Berry A Lunz    PROCEDURE DESCRIPTION: CT ABDOMEN PELVIS W IV CONTRAST    PROCEDURE PERFORMED DATE AND TIME: 04/06/2016 9:49 PM     CT DOSE INFORMATION:  DLP (mGycm):  375    INTRAVENOUS CONTRAST:120 of  Optiray 320     CLINICAL INDICATION: Abd pain, no ileostomy output    COMPARISON: December 01, 2014      FINDINGS: There is mild basilar atelectasis. There is a small amount of  perihepatic ascites. Small amount of free fluid layers within the pelvis.    Liver, spleen,  adrenal glands, and pancreas do not show acute abnormality.  Gallstones are present within the gallbladder lumen. Gallstones are noted  at the neck of the gallbladder.    There is no hydronephrosis of the kidneys. Bilateral renal cysts are noted.  Increased density within an exophytic lesion at the lateral right kidney is  unchanged from December 01, 2014. This likely reflects a proteinaceous or  hemorrhagic cyst.    There is a small hiatal hernia. There are multiple dilated fluid-filled  loops of small bowel compatible with small bowel obstruction. This extends  to the ostomy site. 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 There is fluid  adjacent to the ostomy. The small bowel loops at the ostomy site appears  slightly kinked and minimally decompressed with mucosal enhancement near  the skin surface.    No free intraperitoneal air is identified. The colon appears absent. There  is grade 1 L4-L5 anterolisthesis. There are multilevel degenerative changes  present throughout the lumbar spine.    Prostate gland is mildly prominent in size. There is a central area of  decreased attenuation within the prostate gland suggesting prior surgery.  The urinary bladder is distended. Vascular calcifications present abdominal  aorta with tortuosity of vessel. No definite evidence for aortic aneurysm.  Fat-containing left inguinal hernia is present.      Impression    1. Dilated fluid-filled loops of distal small bowel compatible with small  bowel obstruction. Dilated small bowel extends to the ostomy site. The  small bowel at the ostomy site is somewhat looped and kinked, which may be  the source of the obstruction.  2. Small amount of ascites surrounding the liver and layering within the  pelvis. No evidence for free intraperitoneal air or intra-abdominal  abscess.  3. Cholelithiasis.  4. Numerous additional findings as detailed above.          The CT exam was performed using one or more the following a dose reduction  techniques:  Automated exposure control, adjustment of the mA and/or kV  according to the patient's size, or use of iterative reconstruction  technique.         Orders Placed This Encounter    CT ABDOMEN PELVIS W IV CONTRAST    XR NG TUBE PLACEMENT  CBC/DIFF    BASIC METABOLIC PANEL    HEPATIC FUNCTION PANEL    LIPASE    CBC WITH DIFF    ioversol (OPTIRAY 320) infusion    NS 10 mL injection       Abnormal Lab results:  Labs Reviewed   CBC WITH DIFF - Abnormal; Notable for the following:        Result Value    WBC 9.5 (*)     MCV 96.6 (*)     NEUTROPHIL # 7.50 (*)     All other components within normal limits   HEPATIC FUNCTION PANEL - Normal   LIPASE - Normal   CBC/DIFF    Narrative:     The following orders were created for panel order CBC/DIFF.  Procedure                               Abnormality         Status                     ---------                               -----------         ------                     CBC WITH [id number]                Abnormal            Final result                 Please view results for these tests on the individual orders.   BASIC METABOLIC PANEL         Plan: Appropriate labs and imaging ordered. Medical Records reviewed.    MDM:   During the patient's stay in the emergency department, the above listed imaging and/or labs were performed to assist with medical decision making and were reviewed by myself when available for review.  CT results reviewed and discussed with Dr. Christel Mormon.  Over the area around the ileostomy remains easily reducible although he still has no output.  He remains nontender.  At this point will place an NG to and admit the hospital for further observation and evaluation.    Pt remained stable throughout the emergency department course.      Impression: small bowel obstruction  Plan: admit FM  I am scribing for, and in the presence of, Dr. Ruthann Cancer for services provided on 04/06/2016.  Zollie Pee, SCRIBE     Zollie Pee, SCRIBE  04/06/2016,  20:09    I personally performed the services described in this documentation, as scribed  in my presence, and it is both accurate  and complete.    Genevie Cheshire, MD  Genevie Cheshire, MD  04/06/2016, 23:05

## 2016-04-06 NOTE — H&P (Signed)
Family Medicine     History and Physical    Sliwinski,Gilbert A, 70 y.o. male  Date of Admission:  04/06/2016  Date of Birth:  Jan 19, 1946    Information Obtained from: patient  Chief Complaint:  Abdominal cramping    HPI: [name] is a 70 y.o., White male with history of proctocolectomy with ileostomy placement by Dr Rowe Clack on 10/15 due to history of colon cancer. Patient presents last night with complaints of generalized cramping abdominal pain that he rates as 2/10 in severity, which has improved now. He reports minimal output via ileostomy. Thinks it is clogged. 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 Currently, patient denies any fevers, chills, N/V, SOB, CP. States he has had decreased appetite today. But no other major complaints. CT scan of abdomen showed SBO. Dr Christel Mormon was consulted who recommended NG tube placement and admission.       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[place]) 03/05/2013         Past Surgical History:   Procedure Laterality Date    COLONOSCOPY      HX OTHER  2015    Proctolectomy     HX SMALL BOWEL RESECTION  2010    ILEOSTOMY           Medications Prior to Admission     Prescriptions    amLODIPine (NORVASC) 2.5 mg Oral Tablet    Take 1 Tab (2.5 mg total) by mouth Once a day    CALCIUM CARBONATE/VITAMIN D3 (CALCIUM 600 + D,3, ORAL)    Take by mouth    cholecalciferol, vitamin D3, 1,000 unit Oral Tablet    Take 1,000 Units by mouth Once a day    levothyroxine (SYNTHROID) 50 mcg Oral Tablet    Take 1 Tab (50 mcg total) by mouth Once a day    Patient not taking:  Reported on 05/25/2014    loperamide (IMODIUM) 2 mg Oral Capsule    Take 2 mg by mouth Every 6 hours as needed    MULTIVITAMINS WITH  FLUORIDE (MULTI-VITAMIN PO)    Take by mouth Once a day    propranolol (INDERAL) 10 mg Oral Tablet    Take 1 Tab (10 mg total) by mouth Per instructions Take one tab PO every 6 hours as needed for tremor    Patient not taking:  Reported on 08/09/2015    Sildenafil (VIAGRA) 25 mg Oral Tablet    Take 1 Tab (25 mg total) by mouth Every 24 hours as needed    thyroid (ARMOUR THYROID) 60 mg Oral Tablet    Take 60 mg by mouth Once a day        No Known Allergies  Social History   Substance Use Topics    Smoking status: Never Smoker    Smokeless tobacco: Never Used    Alcohol use No     Family History:  stroke    ROS: Other than ROS in the HPI, all other systems were negative.    EXAM:  Temperature: 36.7 C (98.1 F)  Heart Rate: 84  BP (Non-Invasive): (!) 153/93  Respiratory Rate:  16  SpO2-1: 97 %  Pain Score (Numeric, Faces): 1  Constitutional: Pt is well-developed and well-nourished.   Head: Normocephalic and atraumatic.   Eyes: Conjunctivae are normal. Pupils are equal, round, and reactive to light. EOM are intact  Neck: supple  Cardiovascular: RRR. No Murmurs/rubs/gallops.   Chest: CTA B/L. No audible wheezes or crackles are noted.  Abdominal: Soft, nontender, nondistended.  No rebound, guarding, or masses. Ileostomy in place RLQ. Post op surgical scar  Neurological: CN 2-12 grossly intact.  No focal deficits noted.  Skin: Warm and dry. No rash or lesions  Psychiatric: Patient has a normal mood and affect.   Extremities: no lower extremity edema    LABS:    Lab Results Today:    Results for orders placed or performed during the hospital encounter of 04/06/16 (from the past 24 hour(s))   BASIC METABOLIC PANEL   Result Value Ref Range    SODIUM 136 136 - 144 mmol/L    POTASSIUM 3.8 3.6 - 5.1 mmol/L    CHLORIDE 103 101 - 111 mmol/L    CO2 TOTAL 29 22 - 32 mmol/L    ANION GAP 4 mmol/L    CALCIUM 9.4 8.9 - 10.3 mg/dL    GLUCOSE 100 70 - 110 mg/dL    BUN 13 8-20 mg/dL mg/dL    CREATININE 0.67 0.6-1.2 mg/dL mg/dL     BUN/CREA RATIO 19     ESTIMATED GFR 117 Avg: 75 mL/min/1.45m2   HEPATIC FUNCTION PANEL   Result Value Ref Range    ALBUMIN 4.0 3.5 - 4.8 g/dL    ALKALINE PHOSPHATASE 41 20 - 130 U/L    ALT (SGPT) 22 7 - 42 U/L    AST (SGOT) 22 12 - 35 U/L    BILIRUBIN TOTAL 1.2 0.3 - 1.2 mg/dL    BILIRUBIN DIRECT 0.2 0.1 - 0.5 mg/dL    PROTEIN TOTAL 6.4 6.4 - 8.3 g/dL   LIPASE   Result Value Ref Range    LIPASE 31 22 - 51 U/L   CBC WITH DIFF   Result Value Ref Range    WBC 9.5 (H) 3.3 - 9.3 x103/uL    RBC 4.81 4.40 - 5.68 x106/uL    HGB 15.8 13.4 - 17.3 g/dL    HCT 46.5 38.9 - 50.5 %    MCV 96.6 (H) 82.4 - 95.0 fL    MCH 32.8 27.9 - 33.1 pg    MCHC 34.0 32.8 - 36.0 g/dL    RDW 14.1 10.9 - 15.1 %    PLATELETS 206 140 - 440 x103/uL    MPV 7.8 6.0 - 10.2 fL    NEUTROPHIL % 78 %    LYMPHOCYTE % 13 %    MONOCYTE % 8 %    EOSINOPHIL % 1 %    BASOPHIL % 0 %    NEUTROPHIL # 7.50 (H) 1.60 - 5.50 x103/uL    LYMPHOCYTE # 1.20 0.80 - 3.20 x103/uL    MONOCYTE # 0.70 0.20 - 0.80 x103/uL    EOSINOPHIL # 0.10 0.00 - 0.50 x103/uL    BASOPHIL # 0.00 0.00 - 0.20 x103/uL       Radiology Results:  Results for orders placed or performed during the hospital encounter of 04/06/16 (from the past 72 hour(s))   CT ABDOMEN PELVIS W IV CONTRAST     Status: None    Narrative    Anquan A Italiano    PROCEDURE DESCRIPTION: CT ABDOMEN PELVIS W IV CONTRAST  PROCEDURE PERFORMED DATE AND TIME: 04/06/2016 9:49 PM     CT DOSE INFORMATION:  DLP (mGycm):  375    INTRAVENOUS CONTRAST:120 of  Optiray 320     CLINICAL INDICATION: Abd pain, no ileostomy output    COMPARISON: December 01, 2014      FINDINGS: There is mild basilar atelectasis. There is a small amount of  perihepatic ascites. Small amount of free fluid layers within the pelvis.    Liver, spleen, adrenal glands, and pancreas do not show acute abnormality.  Gallstones are present within the gallbladder lumen. Gallstones are noted  at the neck of the gallbladder.    There is no hydronephrosis of the  kidneys. Bilateral renal cysts are noted.  Increased density within an exophytic lesion at the lateral right kidney is  unchanged from December 01, 2014. This likely reflects a proteinaceous or  hemorrhagic cyst.    There is a small hiatal hernia. There are multiple dilated fluid-filled  loops of small bowel compatible with small bowel obstruction. This extends  to the ostomy site. 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 There is fluid  adjacent to the ostomy. The small bowel loops at the ostomy site appears  slightly kinked and minimally decompressed with mucosal enhancement near  the skin surface.    No free intraperitoneal air is identified. The colon appears absent. There  is grade 1 L4-L5 anterolisthesis. There are multilevel degenerative changes  present throughout the lumbar spine.    Prostate gland is mildly prominent in size. There is a central area of  decreased attenuation within the prostate gland suggesting prior surgery.  The urinary bladder is distended. Vascular calcifications present abdominal  aorta with tortuosity of vessel. No definite evidence for aortic aneurysm.  Fat-containing left inguinal hernia is present.    Impression    1. Dilated fluid-filled loops of distal small bowel compatible with small  bowel obstruction. Dilated small bowel extends to the ostomy site. The  small bowel at the ostomy site is somewhat looped and kinked, which may be  the source of the obstruction.  2. Small amount of ascites surrounding the liver and layering within the  pelvis. No evidence for free intraperitoneal air or intra-abdominal  abscess.  3. Cholelithiasis.  4. Numerous additional findings as detailed above.         DNR Status:  No Order    ASSESSMENT/PLAN:  Active Hospital Problems    Diagnosis    Small bowel obstruction    Hypertension    Iron deficiency anemia    Hypothyroid     1. SBO  - admit to Inpt bed, VS and pulse ox q8hr  - dilated fluid filled loops of distal small bowel seen on abdominal CT  scan  - Dr Christel Mormon consulted - recommended NG tube placement and admission  - NG tube in place with intermittent light suction  - NPO   - will start MIVF NS at 100 cc/hr    2. Hypothyroidism  - patient follows with Dr Tonye Royalty outpatient  - on oral armour thyroid 60 mg daily, however since patient is NPO will start IV synthroid 50 mcg (patient has tolerated this in the past admissions)    3. HTN  - holding PO Norvasc  - maintain BP < 150/90  - will continue to monitor and add IV antihypertensives if needed    4. Anemia  - patient has been worked up last year at our clinic - work up shows [place]  - he  was advised ferrous sulfate, however has not been taking this. May want to consider restarting iron supplementation.      DVT Risk Factors:  Age greater than 57, history of malignancy  DVT/PE Prophylaxis: Enoxaparin      WDW Dr Conley Rolls and FM team    Eugenie Filler, MD      [address] [place] [address]  Dept: [phone]  Dept Fax: [phone]        Patient reviewed and examined with resident physician. Resident H&P reviewed. I concur with resident H&P above with changes and additions to resident note made.    Marcell Anger M.D.  [facility]

## 2016-04-06 NOTE — ED Nurses Note (Signed)
NG tube placed in right nare at 65cm.

## 2016-04-06 NOTE — ED Nurses Note (Signed)
6S sts ready for pt. Pt tansported to floor via stretcher by tech.

## 2016-04-07 ENCOUNTER — Inpatient Hospital Stay (HOSPITAL_COMMUNITY)
Admission: RE | Admit: 2016-04-07 | Discharge: 2016-04-07 | Disposition: A | Discharge status: Home / Self Care | Payer: BC Managed Care – PPO | Source: Ambulatory Visit

## 2016-04-07 DIAGNOSIS — D509 Iron deficiency anemia, unspecified: Secondary | ICD-10-CM

## 2016-04-07 DIAGNOSIS — K56609 Unspecified intestinal obstruction, unspecified as to partial versus complete obstruction: Secondary | ICD-10-CM

## 2016-04-07 DIAGNOSIS — K433 Parastomal hernia with obstruction, without gangrene: Secondary | ICD-10-CM

## 2016-04-07 DIAGNOSIS — I10 Essential (primary) hypertension: Secondary | ICD-10-CM

## 2016-04-07 LAB — CBC WITH DIFF
BASOPHIL #: 0 x10ˆ3/uL (ref 0.00–0.20)
BASOPHIL %: 0 %
EOSINOPHIL #: 0 x10ˆ3/uL (ref 0.00–0.50)
EOSINOPHIL %: 0 %
HCT: 42.5 % (ref 38.9–50.5)
HCT: 42.5 % (ref 38.9–50.5)
HGB: 14.3 g/dL (ref 13.4–17.3)
LYMPHOCYTE #: 0.8 x10ˆ3/uL (ref 0.80–3.20)
LYMPHOCYTE %: 9 %
MCH: 32.3 pg (ref 27.9–33.1)
MCHC: 33.5 g/dL (ref 32.8–36.0)
MCV: 96.3 fL — ABNORMAL HIGH (ref 82.4–95.0)
MCV: 96.3 fL — ABNORMAL HIGH (ref 82.4–95.0)
MONOCYTE #: 0.8 x10ˆ3/uL (ref 0.20–0.80)
MONOCYTE %: 9 %
MPV: 7.9 fL (ref 6.0–10.2)
NEUTROPHIL #: 7.3 10*3/uL — ABNORMAL HIGH (ref 1.60–5.50)
NEUTROPHIL #: 7.3 x10ˆ3/uL — ABNORMAL HIGH (ref 1.60–5.50)
NEUTROPHIL %: 82 %
PLATELETS: 189 x10ˆ3/uL (ref 140–440)
RBC: 4.41 x10ˆ6/uL (ref 4.40–5.68)
RDW: 13.8 % (ref 10.9–15.1)
WBC: 8.9 x10ˆ3/uL (ref 3.3–9.3)

## 2016-04-07 LAB — BASIC METABOLIC PANEL
ANION GAP: 4 mmol/L
BUN/CREA RATIO: 19
BUN: 10 mg/dL (ref 8–20)
CALCIUM: 7 mg/dL — ABNORMAL LOW (ref 8.9–10.3)
CHLORIDE: 110 mmol/L (ref 101–111)
CO2 TOTAL: 23 mmol/L (ref 22–32)
CO2 TOTAL: 23 mmol/L (ref 22–32)
CREATININE: 0.53 mg/dL — ABNORMAL LOW (ref 0.6–1.2)
ESTIMATED GFR: 154 mL/min/1.73mˆ2
ESTIMATED GFR: 154 mL/min/{1.73_m2}
GLUCOSE: 98 mg/dL (ref 70–110)
POTASSIUM: 3.1 mmol/L — ABNORMAL LOW (ref 3.6–5.1)
SODIUM: 137 mmol/L (ref 136–144)

## 2016-04-07 LAB — PT/INR: INR: 1.23 — ABNORMAL HIGH (ref 0.90–1.10)

## 2016-04-07 LAB — PHOSPHORUS: PHOSPHORUS: 2.6 mg/dL (ref 2.4–4.7)

## 2016-04-07 LAB — MAGNESIUM: MAGNESIUM: 1.5 mg/dL — ABNORMAL LOW (ref 1.8–2.5)

## 2016-04-07 MED ORDER — SODIUM CHLORIDE 0.9 % (FLUSH) INJECTION SYRINGE
3.0000 mL | INJECTION | Freq: Three times a day (TID) | INTRAMUSCULAR | Status: DC
Start: 2016-04-07 — End: 2016-04-09
  Administered 2016-04-07: 3 mL
  Administered 2016-04-07 (×3): 0 mL
  Administered 2016-04-08: 3 mL
  Administered 2016-04-08: 0 mL
  Administered 2016-04-08: 3 mL
  Administered 2016-04-09 (×2): 0 mL

## 2016-04-07 MED ORDER — ENOXAPARIN 40 MG/0.4 ML SUBCUTANEOUS SYRINGE
40.0000 mg | INJECTION | SUBCUTANEOUS | Status: DC
Start: 2016-04-07 — End: 2016-04-09
  Administered 2016-04-07 – 2016-04-09 (×3): 40 mg via SUBCUTANEOUS
  Filled 2016-04-07 (×4): qty 0.4

## 2016-04-07 MED ORDER — LEVOTHYROXINE 100 MCG IV SOLUTION - EAST
50.00 ug | Freq: Every morning | INTRAVENOUS | Status: DC
Start: 2016-04-07 — End: 2016-04-08
  Administered 2016-04-07 – 2016-04-08 (×2): 50 ug via INTRAVENOUS
  Filled 2016-04-07 (×6): qty 2.5

## 2016-04-07 MED ORDER — ONDANSETRON HCL (PF) 4 MG/2 ML INJECTION SOLUTION
4.0000 mg | Freq: Four times a day (QID) | INTRAMUSCULAR | Status: DC | PRN
Start: 2016-04-07 — End: 2016-04-09
  Administered 2016-04-07 (×2): 4 mg via INTRAVENOUS
  Filled 2016-04-07 (×2): qty 2

## 2016-04-07 MED ORDER — DIPHENHYDRAMINE 50 MG/ML INJECTION SOLUTION
25.00 mg | Freq: Every evening | INTRAMUSCULAR | Status: DC
Start: 2016-04-07 — End: 2016-04-09
  Administered 2016-04-08 (×2): 25 mg via INTRAVENOUS
  Filled 2016-04-07 (×5): qty 1

## 2016-04-07 MED ORDER — SODIUM CHLORIDE 0.9 % (FLUSH) INJECTION SYRINGE
3.0000 mL | INJECTION | INTRAMUSCULAR | Status: DC | PRN
Start: 2016-04-07 — End: 2016-04-09

## 2016-04-07 MED ORDER — MAGNESIUM SULFATE 500 MG/ML (50 %) INJECTION SOLUTION
2.0000 g | Freq: Once | INTRAVENOUS | Status: AC
Start: 2016-04-07 — End: 2016-04-07
  Administered 2016-04-07: 2 g via INTRAVENOUS
  Filled 2016-04-07: qty 4

## 2016-04-07 MED ORDER — SODIUM CHLORIDE 0.9 % INTRAVENOUS SOLUTION
Freq: Once | INTRAVENOUS | Status: AC
Start: 2016-04-07 — End: 2016-04-07
  Filled 2016-04-07: qty 10

## 2016-04-07 MED ORDER — SODIUM CHLORIDE 0.9 % INTRAVENOUS SOLUTION
INTRAVENOUS | Status: DC
Start: 2016-04-07 — End: 2016-04-09
  Administered 2016-04-08: 0 via INTRAVENOUS

## 2016-04-07 MED ADMIN — lactated Ringers intravenous solution: @ 12:00:00

## 2016-04-07 MED ADMIN — sodium chloride 0.9 % intravenous solution: INTRAVENOUS | @ 12:00:00

## 2016-04-07 NOTE — Progress Notes (Signed)
[facility] [place] [address]   Phone: [phone]   Fax: [place] Progress Note    [name]  Date of service: 04/08/2016  Date of Admission:  04/06/2016  Hospital Day:  LOS: 1 day     SUMMARY     [name] is a 70 y.o., White male with 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 Patient admitted for ileostomy parastomal hernia and possible ileus vs Small bowel obstruction after CT abd showed dilated fluid-filled loops of distal bowel kinked and looped around ostomy site. Dr. Christel Mormon consulted and rec bowel decompression via NG tube. Patient is NPO.      SUBJECTIVE     Today:  Patient seen and examined.    Concern: Overnight, Patient had ileostomy output of 2200 cc stool. Patient feels better and feels his obstruction has resolved. No fever, chills, nausea, vomiting, diarrhea, abdominal pain, chest pain, or shortness of breath.      Diet: NPO  BM: ileostomy bag  Urination: good.   Pain mgmt: not requiring.   IVF: 100 ml/hr  Review of systems:  All systems negative except as noted above.      Current Facility-Administered Medications:  diphenhydrAMINE (BENADRYL) 50 mg/mL injection 25 mg Intravenous NIGHTLY   enoxaparin PF (LOVENOX) 40 mg/0.4 mL SubQ injection 40 mg Subcutaneous Q24H   [START ON 04/09/2016] levothyroxine (SYNTHROID) 20 mcg/mL injection 30 mcg Intravenous QAM   NS 10 mL injection 10 mL Intravenous Give in Radiology   NS flush syringe 3 mL Intracatheter Q8HRS   NS flush syringe 3 mL Intracatheter Q1H PRN   NS premix infusion  Intravenous Continuous   ondansetron (ZOFRAN) 2 mg/mL injection 4 mg Intravenous Q6H PRN     OBJECTIVE     Vital Signs:  Patient Vitals for the past 24 hrs:   BP Temp Pulse Resp SpO2   04/08/16 0700 (!) 147/85 36.7 C (98.1 F) 80 18 92 %   04/08/16 0300 (!) 141/82 36.7 C (98.1 F)  78 18 92 %   04/07/16 2300 140/78 36.8 C (98.2 F) 77 18 96 %   04/07/16 1900 (!) 142/80 36.9 C (98.4 F) 79 18 95 %   04/07/16 1500 132/85 36.7 C (98.1 F) 80 18 96 %     Physical Exam:  Constitutional: appears in good health  Eyes: Conjunctiva clear.  ENT: ENMT without erythema or injection, mucous membranes moist.  Neck: no thyromegaly or lymphadenopathy  Respiratory: Clear to auscultation bilaterally.   Cardiovascular: regular rate and rhythm  Gastrointestinal: Soft, non-tender, Bowel sounds normal, Non-tender, non-distended, No hepatosplenomegaly, Bowel sounds: Normal. Ileostomy bad cover the site. Ileostomy site C/D/I. No parastomal hernia noted.   Musculoskeletal: Head atraumatic and normocephalic  Integumentary:  Skin warm and dry  Neurologic: Grossly normal  Lymphatic/Immunologic/Hematologic: No lymphadenopathy  Psychiatric: Normal. Has NG placed.     I/O:  I/O last 24 hours:      Intake/Output Summary (Last 24 hours) at 04/08/16 0857  Last data filed at 04/08/16 0700   Gross per 24 hour   Intake             1100 ml   Output             2600 ml   Net            -  1500 ml   NG tube: 200 cc brownish fluid.     I/O current shift:     Blood Sugars: No results for input(s): GLUIP in the last 48 hours.    LABS & IMAGING       CBC Results    Recent Labs      04/06/16   2032  04/07/16   0402  04/08/16   0334   WBC  9.5*  8.9  8.2   HGB  15.8  14.3  15.0   HCT  46.5  42.5  44.4   PLTCNT  206  189  199      BMP Results    Recent Labs      04/06/16   2032  04/07/16   0402  04/08/16   0334   SODIUM  136  137  138   POTASSIUM  3.8  3.1*  3.9   CHLORIDE  103  110  103   CO2  29  23  27    BUN  13  10  13    CREATININE  0.67  0.53*  0.80   GFR  117  154  96   ANIONGAP  4  4  8       Recent Labs      04/06/16   2032  04/07/16   0402  04/08/16   0334   CALCIUM  9.4  7.0*  8.7*   ALBUMIN  4.0   --    --    MAGNESIUM   --   1.5*  2.2      Cardiac Results   No results for input(s): UHCEASTTROPI, CKMB, MBINDEX, BNP in the last  72 hours.   Liver/Pancrease Enzyme Results   Recent Labs      04/06/16   2032   TOTALPROTEIN  6.4   ALBUMIN  4.0   AST  22   ALT  22   ALKPHOS  41   LIPASE  31      Coagulation Studies    Recent Labs      04/07/16   0402   INR  1.23*      Lipid Panel Results    Lab Results   Component Value Date    CHOLESTEROL 176 03/05/2016    HDLCHOL 50 03/05/2016    LDLCHOLDIR 105 (H) 03/05/2016    TRIG 56 03/05/2016        Imaging:    Results for orders placed or performed during the hospital encounter of 04/06/16 (from the past 24 hour(s))   XR ABD FLAT AND UPRIGHT SERIES (W PA CHEST)     Status: None    Narrative    [name]    PROCEDURE DESCRIPTION: XR ABD FLAT AND UPRIGHT SERIES (W PA CHEST).  PROCEDURE PERFORMED DATE AND TIME: 04/07/2016 12:43 PM.  CLINICAL INDICATION: eval SBO.  TECHNIQUE: 3 view(s) / 3 image(s) submitted.  COMPARISON: Correlated with the CT and plain film images dated 04/06/2016.    FINDINGS:    A nasogastric tube is present. The distal tube tip projects over the  stomach.   Air-fluid levels in mildly distended small bowel loops. This is compatible  with obstruction versus adynamic ileus.      Impression    1. Air-fluid levels in mildly distended small bowel. Compatible with small  bowel obstruction versus adynamic ileus.  2. The nasogastric tube projects in satisfactory position.    EGK  Microbiology/Pathology:      No results found for any visits on 04/06/16 (from the past 96 hour(s)).  No results found for this visit on 04/06/16.  ASSESSMENT & PLAN     Active Hospital Problems    Diagnosis    Small bowel obstruction    Hypertension    Iron deficiency anemia    SBO (small bowel obstruction)    Hypothyroid     1. 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  - improved. Had 2200 cc BM past 24 hours. Likely SBO vs ileus resolved. Abdominal exam benign. Ordered KUB today and will consider d/c NG if obstruction improves.      - Dr. Christel Mormon consulted and appreciated his  recommendations, as per Dr. Christel Mormon continue small bowel decompression via NG tube to allow resolutions of SBO vs ileus.  - Patient NPO now and IVF 100 cc/hr.  - Parastomal hernia reduced in ED.    - CT abd showed, dilated fluid filled loops of distal small bowel.      2. Hypothyroidism  - IV synthroid 30 mcg due to NPO status.    - patient follows with Dr Tonye Royalty outpatient  - at home, oral armour thyroid 60 mg daily.    3. HTN  - holding PO Norvasc  - maintain BP < 150/90  - will continue to monitor and add IV antihypertensives if needed    4. Hx of  IDA  - Iron panel in 2016 showed, Iron 26, Ferritin 12, TIBC 340, Iron sat 8, Transferrin 243.  - Patient was treated with oral iron supplement. Currently Hg 14.3 and MCV 96.3  - Patient is not currently taking any iron supplement. Will monitor.     DVT Risk Factors:  Age greater than 26, history of malignancy  DVT/PE Prophylaxis: Enoxaparin    PT/OT: No  Consults: Dr. Christel Mormon  Code Status: Full Code.   DVT/PE Prophylaxis: Enoxaparin  Disposition Planning:   As per Dr. Christel Mormon.     The patient was given ample opportunity to ask questions and those questions were answered to the patient's satisfaction. The patient was encouraged to be involved in their own care, and all diagnoses, medications, and medication side-effects were discussed. The patient was told to contact me with any additional questions or concerns, and to go to the emergency department in an emergency.    Prem C. Posey Pronto,  MD   PGY-1 Family Med     04/08/2016      04/08/2016  ________________________________________________________________________    Patient seen and examined.   Patient reports 2 bouts of bowel movement this morning.  Nonbloody.  NG tube in place with total of 2200 cc output,  Abdominal pain resolved. I have reviewed the resident's progress note and agree with the findings and plan of care as documented. I have discussed the patient's case and management with the family medicine team.  Any  exceptions/additions are edited/noted.    Candis Musa, MD 04/08/2016, 14:16  Faculty Physician  [facility]  Family Medicine Residency

## 2016-04-07 NOTE — Nurses Notes (Signed)
Dr. Christel Mormon visited digitally mutilated  Ileostomy to ensure patency ileostomy emptied of approx. 154ml liq. Brown stool

## 2016-04-07 NOTE — Care Plan (Signed)
Problem: Patient Care Overview (Adult,OB)  Goal: Plan of Care Review(Adult,OB)  The patient and/or their representative will communicate an understanding of their plan of care   Outcome: Ongoing (see interventions/notes)  Goal: Individualization/Patient Specific Goal(Adult/OB)  Outcome: Ongoing (see interventions/notes)  Goal: Interdisciplinary Rounds/Family Conf  Outcome: Ongoing (see interventions/notes)    Problem: Fall Risk (Adult)  Goal: Absence of Falls  Patient will demonstrate the desired outcomes by discharge/transition of care.   Outcome: Ongoing (see interventions/notes)    Comments:   Resting quietly amb in hall x4 tolerated well NG patent to low intermitten suction output 256ml brown liq abd soft non tender passing sm amt. Flatus no acute distress noted. Lauro Regulus, RN

## 2016-04-07 NOTE — Consults (Signed)
[name]  Consult Note    [name]       70 y.o.       Date of service: 04/07/2016  Date of Admission:  04/06/2016    [name] Day:  LOS: 0 days     HISTORY OF PRESENT ILLNESS:  [name] is [name] 70 y.o. male who was admitted to [name] with [name] small-bowel obstruction. The patient has history of colon [name] and required [name] procto colectomy with ileostomy by Dr. Rowe [name] in 2015. He presented 1 year ago to have [name] prolapsed ileostomy revised as well.  He now tells me that he developed [name] bowel obstruction versus ileus after taking Lomotil as he normally does whenever he needs to change his ostomy appliance.  He tells me this usually slows down his stools enough so that he can proceed with the change.  Since that time yesterday he has developed abdominal distention.  He was seen in he denies fevers chills nausea vomiting or abdominal pain currently.  the emergency department and had [name] CT scan the abdomen pelvis performed. This showed [name] small-bowel obstruction with dilated loops small-bowel extending into [name] parastomal hernia. Dr. Ruthann [name] felt he was able to easily reduce this hernia.  Patient denies abdominal pain. He has [name] nasogastric tube in place with scant drainage at this time.     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    Ulcerative colitis ([name]) 03/05/2013         PAST SURGICAL HISTORY:  Past Surgical History:   Procedure Laterality Date    COLONOSCOPY      HX OTHER  2015    Proctolectomy     HX SMALL BOWEL RESECTION  2010    ILEOSTOMY           FAMILY HISTORY:  Family Medical History     None            SOCIAL HISTORY:  Social History     Social History    Marital status: Married     Spouse name: N/[name]    Number of children: N/[name]    Years of education: N/[name]     Occupational History      [name]     Social History Main Topics    Smoking status: Never Smoker    Smokeless tobacco: Never Used    Alcohol use No    Drug use: No    Sexual activity: Not on file     Other Topics Concern    Not on file     Social History Narrative       ALLERGIES:  No Known Allergies  PROBLEM LIST:  Patient Active Problem List   Diagnosis    Hypothyroid    Neck pain    Elevated BP    Stenosis of rectum    History of malignant neoplasm of large intestine    Ulcerative colitis (HCC)    Hyperkalemia    Hyperphosphatemia    Anemia, unspecified anemia type    Small bowel obstruction    Hypertension    Iron deficiency anemia    SBO (small bowel obstruction)       REVIEW OF SYSTEMS:  Constitutional:  denies unexplained weight loss, night sweats, fatigue/malaise/lethargy, sleeping Eyes visual changes, headache, eye pain, or  double vision   ENT: Denies runny nose, frequent nose bleeds, sinus pain, stuffy ears, ear pain, ringing in ears (tinnitus), gingival bleeding, toothache, sore throat, pain with swallowing  Cardiovascular: Denies chest pain, shortness of breath, or exercise intolerance  Respiratory: Denies cough, sputum, wheeze, haemoptysis, or shortness of breath   Gastrointestinal: Denies unintentional weight loss, difficulty swallowing, bloating, cramping, anorexia, food avoidance, nausea/vomiting.  Genitourinary :  Denies urinary incontinence, dysuria, hematuria, nocturia, or polyuria  Musculoskeletal: Denies pain, stiffness , joint swelling   Integumentary: Denies rashes, lesions, wounds, or excessive dryness of the skin   Breast: Denies pain, soreness, lumps, or discharge.   Neurological: Denies any changes in sight, smell, hearing and taste, no evidence of limb weakness.   Psychiatric: Denies depression, change in sleep patterns or anxiety.    PHYSICAL EXAMINATION:  Filed Vitals:    04/06/16 2115 04/06/16 2228 04/07/16 0012 04/07/16 0700   BP: (!) 156/91 (!) 154/92 (!) 153/93 (!) 148/91     Pulse: 81 77 84 73   Resp: 16 16 16 17    Temp:   36.7 C (98.1 F) 36.7 C (98.1 F)   SpO2: 98% 98% 97% 97%       Intake/Output Summary (Last 24 hours) at 04/07/16 1559  Last data filed at 04/07/16 0900   Gross per 24 hour   Intake                0 ml   Output              925 ml   Net             -925 ml     I have reviewed the vitals.  General: Pleasant, awake, alert, no acute distress.   HEENT: No scleral icterus or conjunctival injection. Bilateral TM pearly grey without erythema or bulging. No posterior oropharynx erythema or exudate. No oral ulcer or thrush.   Lymph: No anterior or posterior head or neck lymphadenopathy.   CV: RRR without murmurs.   Respiratory: Lungs CTA bilateral, anterior and posterior lung fields without wheezing or crackles.   Abdomen: No palpable hepatosplenomegaly.  Ileostomy with minimal liquid in back.  No obvious parastomal hernia at this time.  Abdomen appears mildly distended but nontender.  Extremities: Pink, intact  Neurological: CN II-XII grossly intact. AA&O x 3 without deficits. Speech clear. Upper and lower extremity strength and sensation symmetrical and intact.   Skin: Pink      Labs:     Results for orders placed or performed during the [name] encounter of 04/06/16 (from the past 24 hour(s))   CBC/DIFF    Narrative    The following orders were created for panel order CBC/DIFF.  Procedure                               Abnormality         Status                     ---------                               -----------         ------  CBC WITH [id number]                Abnormal            Final result                 Please view results for these tests on the individual orders.   BASIC METABOLIC PANEL   Result Value Ref Range    SODIUM 136 136 - 144 mmol/L    POTASSIUM 3.8 3.6 - 5.1 mmol/L    CHLORIDE 103 101 - 111 mmol/L    CO2 TOTAL 29 22 - 32 mmol/L    ANION GAP 4 mmol/L    CALCIUM 9.4 8.9 - 10.3 mg/dL    GLUCOSE 100 70 - 110 mg/dL    BUN 13 8-20  mg/dL mg/dL    CREATININE 0.67 0.6-1.2 mg/dL mg/dL    BUN/CREA RATIO 19     ESTIMATED GFR 117 Avg: 75 mL/min/1.53m2   HEPATIC FUNCTION PANEL   Result Value Ref Range    ALBUMIN 4.0 3.5 - 4.8 g/dL    ALKALINE PHOSPHATASE 41 20 - 130 U/L    ALT (SGPT) 22 7 - 42 U/L    AST (SGOT) 22 12 - 35 U/L    BILIRUBIN TOTAL 1.2 0.3 - 1.2 mg/dL    BILIRUBIN DIRECT 0.2 0.1 - 0.5 mg/dL    PROTEIN TOTAL 6.4 6.4 - 8.3 g/dL   LIPASE   Result Value Ref Range    LIPASE 31 22 - 51 U/L   CBC WITH DIFF   Result Value Ref Range    WBC 9.5 (H) 3.3 - 9.3 x103/uL    RBC 4.81 4.40 - 5.68 x106/uL    HGB 15.8 13.4 - 17.3 g/dL    HCT 46.5 38.9 - 50.5 %    MCV 96.6 (H) 82.4 - 95.0 fL    MCH 32.8 27.9 - 33.1 pg    MCHC 34.0 32.8 - 36.0 g/dL    RDW 14.1 10.9 - 15.1 %    PLATELETS 206 140 - 440 x103/uL    MPV 7.8 6.0 - 10.2 fL    NEUTROPHIL % 78 %    LYMPHOCYTE % 13 %    MONOCYTE % 8 %    EOSINOPHIL % 1 %    BASOPHIL % 0 %    NEUTROPHIL # 7.50 (H) 1.60 - 5.50 x103/uL    LYMPHOCYTE # 1.20 0.80 - 3.20 x103/uL    MONOCYTE # 0.70 0.20 - 0.80 x103/uL    EOSINOPHIL # 0.10 0.00 - 0.50 x103/uL    BASOPHIL # 0.00 0.00 - 0.20 x103/uL   CBC/DIFF    Narrative    The following orders were created for panel order CBC/DIFF.  Procedure                               Abnormality         Status                     ---------                               -----------         ------                     CBC WITH [id number]  Abnormal            Final result                 Please view results for these tests on the individual orders.   BASIC METABOLIC PANEL, NON-FASTING   Result Value Ref Range    SODIUM 137 136 - 144 mmol/L    POTASSIUM 3.1 (L) 3.6 - 5.1 mmol/L    CHLORIDE 110 101 - 111 mmol/L    CO2 TOTAL 23 22 - 32 mmol/L    ANION GAP 4 mmol/L    CALCIUM 7.0 (L) 8.9 - 10.3 mg/dL    GLUCOSE 98 70 - 110 mg/dL    BUN 10 8-20 mg/dL mg/dL    CREATININE 0.53 (L) 0.6-1.2 mg/dL mg/dL    BUN/CREA RATIO 19     ESTIMATED GFR 154 Avg: 75 mL/min/1.32m2      MAGNESIUM   Result Value Ref Range    MAGNESIUM 1.5 (L) 1.8 - 2.5 mg/dL   PHOSPHORUS   Result Value Ref Range    PHOSPHORUS 2.6 2.4 - 4.7 mg/dL   PT/INR   Result Value Ref Range    INR 1.23 (H) 0.90 - 1.10   CBC WITH DIFF   Result Value Ref Range    WBC 8.9 3.3 - 9.3 x103/uL    RBC 4.41 4.40 - 5.68 x106/uL    HGB 14.3 13.4 - 17.3 g/dL    HCT 42.5 38.9 - 50.5 %    MCV 96.3 (H) 82.4 - 95.0 fL    MCH 32.3 27.9 - 33.1 pg    MCHC 33.5 32.8 - 36.0 g/dL    RDW 13.8 10.9 - 15.1 %    PLATELETS 189 140 - 440 x103/uL    MPV 7.9 6.0 - 10.2 fL    NEUTROPHIL % 82 %    LYMPHOCYTE % 9 %    MONOCYTE % 9 %    EOSINOPHIL % 0 %    BASOPHIL % 0 %    NEUTROPHIL # 7.30 (H) 1.60 - 5.50 x103/uL    LYMPHOCYTE # 0.80 0.80 - 3.20 x103/uL    MONOCYTE # 0.80 0.20 - 0.80 x103/uL    EOSINOPHIL # 0.00 0.00 - 0.50 x103/uL    BASOPHIL # 0.00 0.00 - 0.20 x103/uL       I have reviewed all labs.      Current Facility-Administered Medications:     enoxaparin PF (LOVENOX) 40 mg/0.4 mL SubQ injection, 40 mg, Subcutaneous, Q24H, Eugenie Filler, MD, 40 mg at 04/07/16 1134    levothyroxine (SYNTHROID) 20 mcg/mL injection, 50 mcg, Intravenous, QAM, Eugenie Filler, MD, 50 mcg at 04/07/16 [id number]    NS 10 mL injection, 10 mL, Intravenous, Give in Radiology, [place], Radiologist, MD    NS flush syringe, 3 mL, Intracatheter, Q8HRS, Eugenie Filler, MD, 3 mL at 04/07/16 1136    NS flush syringe, 3 mL, Intracatheter, Q1H PRN, Eugenie Filler, MD    NS premix infusion, , Intravenous, Continuous, Eugenie Filler, MD, Last Rate: 100 mL/hr at 04/07/16 1136    ondansetron (ZOFRAN) 2 mg/mL injection, 4 mg, Intravenous, Q6H PRN, Eugenie Filler, MD, 4 mg at 04/07/16 1225    Micro: No results found for any visits on 04/06/16 (from the past 96 hour(s)).    Radiology:    Results for orders placed or performed during the [name] encounter of 04/06/16 (from the past 24 hour(s))   CT ABDOMEN PELVIS W IV CONTRAST     Status:  None    Narrative    [name]  [name]    PROCEDURE DESCRIPTION: CT ABDOMEN PELVIS W IV CONTRAST    PROCEDURE PERFORMED DATE AND TIME: 04/06/2016 9:49 PM     CT DOSE INFORMATION:  DLP (mGycm):  375    INTRAVENOUS CONTRAST:120 of  Optiray 320     CLINICAL INDICATION: Abd pain, no ileostomy output    COMPARISON: December 01, 2014      FINDINGS: There is mild basilar atelectasis. There is [name] small amount of  perihepatic ascites. Small amount of free fluid layers within the pelvis.    Liver, spleen, adrenal glands, and pancreas do not show acute abnormality.  Gallstones are present within the gallbladder lumen. Gallstones are noted  at the neck of the gallbladder.    There is no hydronephrosis of the kidneys. Bilateral renal cysts are noted.  Increased density within an exophytic lesion at the lateral right kidney is  unchanged from December 01, 2014. This likely reflects [name] proteinaceous or  hemorrhagic cyst.    There is [name] small hiatal hernia. There are multiple dilated fluid-filled  loops of small bowel compatible with small bowel obstruction. This extends  to the ostomy site. There is [name] small parastomal hernia. There is fluid  adjacent to the ostomy. The small bowel loops at the ostomy site appears  slightly kinked and minimally decompressed with mucosal enhancement near  the skin surface.    No free intraperitoneal air is identified. The colon appears absent. There  is grade 1 L4-L5 anterolisthesis. There are multilevel degenerative changes  present throughout the lumbar spine.    Prostate gland is mildly prominent in size. There is [name] central area of  decreased attenuation within the prostate gland suggesting prior surgery.  The urinary bladder is distended. Vascular calcifications present abdominal  aorta with tortuosity of vessel. No definite evidence for aortic aneurysm.  Fat-containing left inguinal hernia is present.      Impression    1. Dilated fluid-filled loops of distal small bowel compatible with small  bowel obstruction. Dilated small bowel extends  to the ostomy site. The  small bowel at the ostomy site is somewhat looped and kinked, which may be  the source of the obstruction.  2. Small amount of ascites surrounding the liver and layering within the  pelvis. No evidence for free intraperitoneal air or intra-abdominal  abscess.  3. Cholelithiasis.  4. Numerous additional findings as detailed above.          The CT exam was performed using one or more the following [name] dose reduction  techniques: Automated exposure control, adjustment of the mA and/or kV  according to the patient's size, or use of iterative reconstruction  technique.     XR NG TUBE PLACEMENT     Status: None    Narrative    [name]    PROCEDURE DESCRIPTION: XR NG TUBE PLACEMENT    PROCEDURE PERFORMED DATE AND TIME: 04/06/2016 11:45 PM    CLINICAL INDICATION: NGT Placement    TECHNIQUE: 1 views / 1 images submitted.    FINDINGS: Enteric tube terminates at the level of the gastric fundus.  Distended air-filled small bowel loops are noted.      Impression    Enteric tube terminates at the level of the stomach.     XR ABD FLAT AND UPRIGHT SERIES (W PA CHEST)     Status: None    Narrative    [name]  PROCEDURE DESCRIPTION: XR ABD FLAT AND UPRIGHT SERIES (W PA CHEST).  PROCEDURE PERFORMED DATE AND TIME: 04/07/2016 12:43 PM.  CLINICAL INDICATION: eval SBO.  TECHNIQUE: 3 view(s) / 3 image(s) submitted.  COMPARISON: Correlated with the CT and plain film images dated 04/06/2016.    FINDINGS:    [name] nasogastric tube is present. The distal tube tip projects over the  stomach.   Air-fluid levels in mildly distended small bowel loops. This is compatible  with obstruction versus adynamic ileus.      Impression    1. Air-fluid levels in mildly distended small bowel. Compatible with small  bowel obstruction versus adynamic ileus.  2. The nasogastric tube projects in satisfactory position.    EGK          ASSESSMENT & RECOMMENDATIONS:   [name] is [name] 70 y.o.male with ileostomy parastomal hernia and  small-bowel obstruction    1. The parastomal hernia feels as though it has been reduced.     2.  There is some liquid stool in the ostomy but not as much as I would expect if the obstruction was completely relieved.    3. Continue nasogastric tube drainage.  Hopefully we can decompress small-bowel and allow the ileus versus bowel obstruction to resolve    4. The patient has no tenderness at the ostomy or the abdomen. He understands to notify me immediately should he become tender.    5. Stress ulcer and DVT prophylaxis    6. Ambulate.    Melida Quitter, MD  [facility]

## 2016-04-07 NOTE — Care Plan (Addendum)
Problem: Patient Care Overview (Adult,OB)  Goal: Plan of Care Review(Adult,OB)  The patient and/or their representative will communicate an understanding of their plan of care   Outcome: Ongoing (see interventions/notes)    Problem: Fall Risk (Adult)  Goal: Identify Related Risk Factors and Signs and Symptoms  Related risk factors and signs and symptoms are identified upon initiation of Human Response Clinical Practice Guideline (CPG).   Outcome: Completed Date Met:  04/07/16  Goal: Absence of Falls  Patient will demonstrate the desired outcomes by discharge/transition of care.   Outcome: Ongoing (see interventions/notes)    Comments:   END OF SHIFT REPORT: Patient vomited x 1 post admit to [facility]. Emesis 50 ml of maroon brown liquid. NG tube placed in ER. Xray verification of NG tube placement. No initial order for floor to suction.  Dr. Toy Care gave verbal orders over phone to low intermittent suction. Patient has a surgical history with Dr. Rowe Clack and would like him to be consulted. Patient resting in bed. Will continue to monitor. Diamond Nickel, RN

## 2016-04-08 ENCOUNTER — Inpatient Hospital Stay (HOSPITAL_COMMUNITY): Payer: BC Managed Care – PPO

## 2016-04-08 ENCOUNTER — Inpatient Hospital Stay (HOSPITAL_COMMUNITY): Payer: Self-pay

## 2016-04-08 ENCOUNTER — Inpatient Hospital Stay (HOSPITAL_COMMUNITY)
Admission: RE | Admit: 2016-04-08 | Discharge: 2016-04-08 | Disposition: A | Discharge status: Home / Self Care | Payer: BC Managed Care – PPO | Source: Ambulatory Visit

## 2016-04-08 DIAGNOSIS — Z932 Ileostomy status: Secondary | ICD-10-CM

## 2016-04-08 DIAGNOSIS — K56609 Unspecified intestinal obstruction, unspecified as to partial versus complete obstruction: Secondary | ICD-10-CM

## 2016-04-08 LAB — CBC WITH DIFF
BASOPHIL #: 0.1 x10ˆ3/uL (ref 0.00–0.20)
BASOPHIL %: 1 %
EOSINOPHIL #: 0 x10ˆ3/uL (ref 0.00–0.50)
EOSINOPHIL %: 1 %
HCT: 44.4 % (ref 38.9–50.5)
HGB: 15 g/dL (ref 13.4–17.3)
LYMPHOCYTE #: 0.9 x10ˆ3/uL (ref 0.80–3.20)
LYMPHOCYTE %: 11 %
MCH: 32.4 pg (ref 27.9–33.1)
MCHC: 33.8 g/dL (ref 32.8–36.0)
MCV: 95.8 fL — ABNORMAL HIGH (ref 82.4–95.0)
MONOCYTE #: 1.3 x10ˆ3/uL — ABNORMAL HIGH (ref 0.20–0.80)
MONOCYTE %: 16 %
MPV: 8.2 fL (ref 6.0–10.2)
NEUTROPHIL #: 5.8 x10ˆ3/uL — ABNORMAL HIGH (ref 1.60–5.50)
NEUTROPHIL %: 71 %
PLATELETS: 199 x10ˆ3/uL (ref 140–440)
RBC: 4.64 x10ˆ6/uL (ref 4.40–5.68)
RDW: 14.2 % (ref 10.9–15.1)
WBC: 8.2 x10ˆ3/uL (ref 3.3–9.3)

## 2016-04-08 LAB — BASIC METABOLIC PANEL
ANION GAP: 8 mmol/L
BUN/CREA RATIO: 16
BUN: 13 mg/dL (ref 8–20)
CALCIUM: 8.7 mg/dL — ABNORMAL LOW (ref 8.9–10.3)
CHLORIDE: 103 mmol/L (ref 101–111)
CO2 TOTAL: 27 mmol/L (ref 22–32)
CREATININE: 0.8 mg/dL (ref 0.6–1.2)
ESTIMATED GFR: 96 mL/min/1.73mˆ2
GLUCOSE: 106 mg/dL (ref 70–110)
POTASSIUM: 3.9 mmol/L (ref 3.6–5.1)
SODIUM: 138 mmol/L (ref 136–144)

## 2016-04-08 LAB — PHOSPHORUS: PHOSPHORUS: 3.2 mg/dL (ref 2.4–4.7)

## 2016-04-08 LAB — MAGNESIUM: MAGNESIUM: 2.2 mg/dL (ref 1.8–2.5)

## 2016-04-08 MED ORDER — PANTOPRAZOLE 40 MG INTRAVENOUS SOLUTION
40.0000 mg | Freq: Every day | INTRAVENOUS | Status: DC
Start: 2016-04-08 — End: 2016-04-09
  Administered 2016-04-08 – 2016-04-09 (×3): 40 mg via INTRAVENOUS
  Filled 2016-04-08 (×3): qty 10

## 2016-04-08 MED ORDER — ACETAMINOPHEN 325 MG TABLET
650.00 mg | ORAL_TABLET | ORAL | Status: DC | PRN
Start: 2016-04-08 — End: 2016-04-09
  Administered 2016-04-08 (×2): 650 mg via ORAL
  Filled 2016-04-08 (×2): qty 2

## 2016-04-08 MED ORDER — LEVOTHYROXINE 100 MCG IV SOLUTION - EAST
30.00 ug | Freq: Every morning | INTRAVENOUS | Status: DC
Start: 2016-04-09 — End: 2016-04-09
  Administered 2016-04-09: 30 ug via INTRAVENOUS
  Filled 2016-04-08: qty 2
  Filled 2016-04-08 (×2): qty 1.5

## 2016-04-08 MED ADMIN — lactated Ringers intravenous solution: INTRAVENOUS | @ 20:00:00

## 2016-04-08 MED ADMIN — sodium chloride 0.9 % intravenous solution: INTRAVENOUS | @ 01:00:00

## 2016-04-08 MED ADMIN — THROMBIN 5,000 UNITS IN NS: INTRAVENOUS | @ 14:00:00 | NDC 28400010541

## 2016-04-08 MED ADMIN — potassium chloride 10 mEq/100mL in sterile water intravenous piggyback: INTRAVENOUS | @ 08:00:00

## 2016-04-08 NOTE — Care Plan (Signed)
Problem: Patient Care Overview (Adult,OB)  Goal: Plan of Care Review(Adult,OB)  The patient and/or their representative will communicate an understanding of their plan of care   Outcome: Ongoing (see interventions/notes)  Goal: Individualization/Patient Specific Goal(Adult/OB)  Outcome: Ongoing (see interventions/notes)  Goal: Interdisciplinary Rounds/Family Conf  Outcome: Ongoing (see interventions/notes)    Problem: Fall Risk (Adult)  Goal: Absence of Falls  Patient will demonstrate the desired outcomes by discharge/transition of care.   Outcome: Ongoing (see interventions/notes)

## 2016-04-08 NOTE — Care Plan (Signed)
Problem: Patient Care Overview (Adult,OB)  Goal: Individualization/Patient Specific Goal(Adult/OB)  Outcome: Ongoing (see interventions/notes)  Goal: Interdisciplinary Rounds/Family Conf  Outcome: Ongoing (see interventions/notes)    Problem: Fall Risk (Adult)  Goal: Absence of Falls  Patient will demonstrate the desired outcomes by discharge/transition of care.   Outcome: Ongoing (see interventions/notes)    Problem: Skin Injury Risk (Adult,Obstetrics,Pediatric)  Goal: Identify Related Risk Factors and Signs and Symptoms  Related risk factors and signs and symptoms are identified upon initiation of Human Response Clinical Practice Guideline (CPG).  Outcome: Completed Date Met:  04/08/16

## 2016-04-08 NOTE — Care Plan (Signed)
Problem: Patient Care Overview (Adult,OB)  Goal: Plan of Care Review(Adult,OB)  The patient and/or their representative will communicate an understanding of their plan of care   Outcome: Ongoing (see interventions/notes)  Goal: Individualization/Patient Specific Goal(Adult/OB)  Outcome: Ongoing (see interventions/notes)  Goal: Interdisciplinary Rounds/Family Conf  Outcome: Ongoing (see interventions/notes)    Problem: Fall Risk (Adult)  Goal: Absence of Falls  Patient will demonstrate the desired outcomes by discharge/transition of care.   Outcome: Ongoing (see interventions/notes)    Problem: Skin Injury Risk (Adult,Obstetrics,Pediatric)  Goal: Skin Health and Integrity  Patient will demonstrate the desired outcomes by discharge/transition of care.   Outcome: Ongoing (see interventions/notes)    Problem: Bowel Obstruction (Adult)  Prevent and manage potential problems including: 1. diarrhea 2. electrolyte/acid-base imbalance 3. fluid volume deficit/hypovolemia 4. nausea and vomiting 5. pain 6. peritonitis 7. situational response 8. undernutrition   Goal: Signs and Symptoms of Listed Potential Problems Will be Absent, Minimized or Managed (Bowel Obstruction)  Signs and symptoms of listed potential problems will be absent, minimized or managed by discharge/transition of care (reference Bowel Obstruction (Adult) CPG).   Outcome: Ongoing (see interventions/notes)    Problem: Nutrition, Imbalanced: Inadequate Oral Intake (Adult)  Goal: Identify Related Risk Factors and Signs and Symptoms  Related risk factors and signs and symptoms are identified upon initiation of Human Response Clinical Practice Guideline (CPG).   Outcome: Ongoing (see interventions/notes)  Goal: Improved Oral Intake  Patient will demonstrate the desired outcomes by discharge/transition of care.   Outcome: Ongoing (see interventions/notes)  Goal: Prevent Further Weight Loss  Patient will demonstrate the desired outcomes by discharge/transition of  care.   Outcome: Ongoing (see interventions/notes)

## 2016-04-08 NOTE — Care Plan (Signed)
Patient resting in bed. NG removed. CT showed resolving sbo, Clear liquids. Iran Sizer, RN

## 2016-04-08 NOTE — Progress Notes (Signed)
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 Patient admitted for ileostomy parastomal hernia and possible ileus vs Small bowel obstruction after CT abd showed dilated fluid-filled loops of distal bowel kinked and looped around ostomy site. Dr. Christel Mormon consulted and rec bowel decompression via NG tube. Repeated KUB showed improvement in Small bowel obstruction and subsequently NG tube removed. Currently on clear liquid.     SUBJECTIVE     Today:  Patient seen and examined.    Concern: No acute concern. Slept well last night. No fever, chills, nausea, vomiting, diarrhea, abdominal pain, chest pain, or shortness of breath. Tolerating diet well.     Diet: clear liquid  BM: ileostomy bag about 1000 cc output.   Urination: Good.  Pain mgmt: 650 mg PO Tylenol q4h PRN.   IVF: 100 cc/hr NS.  Review of systems:  All systems negative except as noted above.      Current Facility-Administered Medications:  acetaminophen (TYLENOL) tablet 650 mg Oral Q4H PRN   diphenhydrAMINE (BENADRYL) 50 mg/mL injection 25 mg Intravenous NIGHTLY   enoxaparin PF (LOVENOX) 40 mg/0.4 mL SubQ injection 40 mg Subcutaneous Q24H   levothyroxine (SYNTHROID) 20 mcg/mL injection 30 mcg Intravenous QAM   NS 10 mL injection 10 mL Intravenous Give in Radiology   NS flush syringe 3 mL Intracatheter Q8HRS   NS flush syringe 3 mL Intracatheter Q1H PRN   NS premix infusion  Intravenous Continuous   ondansetron (ZOFRAN) 2 mg/mL injection 4 mg Intravenous Q6H PRN   pantoprazole (PROTONIX) injection 40 mg Intravenous Daily          OBJECTIVE     Vital Signs:  Patient Vitals for the past 24 hrs:   BP Temp Pulse Resp SpO2   04/09/16 0036 133/78 37.3 C (99.1 F) 73 16 94 %   04/08/16 1945 (!) 145/80 36.7 C (98.1 F) 80 18 93 %   04/08/16 1500 (!) 144/89 36.5 C (97.7 F) 85 18 90 %   04/08/16 0700 (!) 147/85 36.7 C (98.1 F) 80 18 92 %       Physical Exam:  Constitutional: appears in good health  Eyes: Conjunctiva clear.  ENT: ENMT without erythema or injection, mucous membranes moist.  Neck: no thyromegaly or lymphadenopathy  Respiratory: Clear to auscultation bilaterally.   Cardiovascular: regular rate and rhythm  Gastrointestinal: Soft, non-tender, Bowel sounds normal, non-distended, No hepatosplenomegaly. Ileostomy RLQ. Incision midline looks C/D/I.   Musculoskeletal: Head atraumatic and normocephalic  Integumentary:  Skin warm and dry  Neurologic: Grossly normal  Lymphatic/Immunologic/Hematologic: No lymphadenopathy  Psychiatric: Normal    I/O:  I/O last 24 hours:      Intake/Output Summary (Last 24 hours) at 04/09/16 0655  Last data filed at 04/09/16 0510   Gross per 24 hour   Intake             1852 ml   Output             3125 ml  Net            -1273 ml     I/O current shift:  11/14 0000 - 11/14 0759  In: -   Out: [id number] [Urine:250; Stool:525]  Blood Sugars: No results for input(s): GLUIP in the last 48 hours.    LABS & IMAGING       CBC Results    Recent Labs      04/07/16   0402  04/08/16   0334  04/09/16   0511   WBC  8.9  8.2  10.0*   HGB  14.3  15.0  14.1   HCT  42.5  44.4  41.8   PLTCNT  189  199  142      BMP Results    Recent Labs      04/06/16   2032  04/07/16   0402  04/08/16   0334  04/09/16   0511   SODIUM  136  137  138  140   POTASSIUM  3.8  3.1*  3.9  4.4   CHLORIDE  103  110  103  109   CO2  29  23  27  26    BUN  13  10  13  19    CREATININE  0.67  0.53*  0.80  0.79   GFR  117  154  96  97   ANIONGAP  4  4  8  5       Recent Labs      04/06/16   2032  04/07/16   0402  04/08/16   0334  04/09/16   0511   CALCIUM  9.4   7.0*  8.7*  8.4*   ALBUMIN  4.0   --    --    --    MAGNESIUM   --   1.5*  2.2  2.2      Cardiac Results   No results for input(s): UHCEASTTROPI, CKMB, MBINDEX, BNP in the last 72 hours.   Liver/Pancrease Enzyme Results   Recent Labs      04/06/16   2032   TOTALPROTEIN  6.4   ALBUMIN  4.0   AST  22   ALT  22   ALKPHOS  41   LIPASE  31      Coagulation Studies    Recent Labs      04/07/16   0402   INR  1.23*      Lipid Panel Results    Lab Results   Component Value Date    CHOLESTEROL 176 03/05/2016    HDLCHOL 50 03/05/2016    LDLCHOLDIR 105 (H) 03/05/2016    TRIG 56 03/05/2016        Imaging:    Results for orders placed or performed during the hospital encounter of 04/06/16 (from the past 24 hour(s))   XR KUB AND UPRIGHT ABDOMEN     Status: None    Narrative    [name]    PROCEDURE DESCRIPTION: XR KUB AND UPRIGHT ABDOMEN    PROCEDURE PERFORMED DATE AND TIME: 04/08/2016 12:22 PM    CLINICAL INDICATION: Small bowel obustruction.    TECHNIQUE: 2 views / 2 images submitted.    COMPARISON: 04/07/2016      FINDINGS: Supine and upright abdominal views, follow-up small bowel  obstruction reveal a nasogastric tube with the tip projected over the  proximal stomach. Small bowel distention is better than that seen  previously. A right abdominal ostomy is reidentified and  there is no free  air.      Impression    Improved small bowel distention. NG tube in the proximal  stomach.  JJ D         Microbiology/Pathology:      No results found for any visits on 04/06/16 (from the past 96 hour(s)).  No results found for this visit on 04/06/16.  ASSESSMENT & PLAN     Active Hospital Problems    Diagnosis    Primary Problem: Small bowel obstruction    Hypertension    Iron deficiency anemia    SBO (small bowel obstruction)    Hypothyroid     1. 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  - Resolving Small bowel obstruction.   - NG tube d/c after repeated KUB showed improvement small bowel obstruction and with  functional ileostomy output showing sign of improvement. No abdominal pain or distension at this time.    - General Surgery consulted and appreciated their recommendations, as per Dr. Rowe Clack,  Resolving Small bowel obstruction and advance to clear liquid diet now.   - IVF 100 cc/hr.  - Parastomal hernia reduced in ED.    - CT abd showed, dilated fluid filled loops of distal small bowel.      2. Hypothyroidism  - IV synthroid 30 mcg due to NPO status.    - patient follows with Dr Tonye Royalty outpatient  - at home, oral armour thyroid 60 mg daily.    3. HTN  - holding PO Norvasc. Will consider re-starting Norvasc today.   - maintain BP < 150/90  - will continue to monitor and add IV antihypertensives if needed    4. Hx of  IDA  - Iron panel in 2016 showed, Iron 26, Ferritin 12, TIBC 340, Iron sat 8, Transferrin 243.  - Patient was treated with oral iron supplement. Currently Hg 14.3 and MCV 96.3  - Patient is not currently taking any iron supplement. Will monitor.     DVT Risk Factors: Age greater than 58, history of malignancy  DVT/PE Prophylaxis: Enoxaparin    PT/OT: No  Consults: Dr. Christel Mormon  Code Status: Full Code.   DVT/PE Prophylaxis: Enoxaparin  Disposition Planning:   As per General surgery. Likely anticipatory discharge today.     The patient was given ample opportunity to ask questions and those questions were answered to the patient's satisfaction. The patient was encouraged to be involved in their own care, and all diagnoses, medications, and medication side-effects were discussed.     Prem C. Posey Pronto,  MD   PGY-1 Family Med     04/09/2016      04/09/2016  ________________________________________________________________________    Patient seen and examined.   No issues overnight.  Tolerating clear diet.  Pain well controlled.  No questions or concerns at this time.  DR. Rowe Clack following,  Possible advance diet today and if tolerated can discharge home this afternoon. I have reviewed the resident's progress  note and agree with the findings and plan of care as documented. I have discussed the patient's case and management with the family medicine team.  Any exceptions/additions are edited/noted.    Candis Musa, MD 04/09/2016, 14:04  Faculty Physician  [facility]  Family Medicine Residency

## 2016-04-08 NOTE — Care Plan (Signed)
Problem: Patient Care Overview (Adult,OB)  Goal: Plan of Care Review(Adult,OB)  The patient and/or their representative will communicate an understanding of their plan of care   Outcome: Ongoing (see interventions/notes)  Pt had ~2078mL output from ostomy over the course of last night. Output loose and brown, no blood noted and pt states relief of abdominal discomfort. NG tube in place at 65cm in R nare and draining brown, clear; placement checked with air auscultation.     Doylene Bode, RN

## 2016-04-08 NOTE — Consults (Signed)
Darrell Darrell  Surgery Progress Note    [name],Darrell Darrell, 70 y.o. male  Date of Birth:  [birth year]  Date of Admission:  04/06/2016  Date of service: 04/08/2016    Post Op Day:   S/P      Assessment/ Plan:  Active Hospital Problems    Diagnosis    Primary Problem: Small bowel obstruction    Hypertension    Iron deficiency anemia    SBO (small bowel obstruction)    Hypothyroid     Resolving small-bowel obstruction. Plan will be to discontinue nasogastric tube and start clear liquid diet.  Darrell Beards, MD    Subjective     Patient feeling much better.  Ileostomy is functional at this time.  No nausea or vomiting.  Current Medications:    Current Facility-Administered Medications:  acetaminophen (TYLENOL) tablet 650 mg Oral Q4H PRN   diphenhydrAMINE (BENADRYL) 50 mg/mL injection 25 mg Intravenous NIGHTLY   enoxaparin PF (LOVENOX) 40 mg/0.4 mL SubQ injection 40 mg Subcutaneous Q24H   [START ON 04/09/2016] levothyroxine (SYNTHROID) 20 mcg/mL injection 30 mcg Intravenous QAM   NS 10 mL injection 10 mL Intravenous Give in Radiology   NS flush syringe 3 mL Intracatheter Q8HRS   NS flush syringe 3 mL Intracatheter Q1H PRN   NS premix infusion  Intravenous Continuous   ondansetron (ZOFRAN) 2 mg/mL injection 4 mg Intravenous Q6H PRN   pantoprazole (PROTONIX) injection 40 mg Intravenous Daily       Objective     Vital Signs:  Temp (24hrs) Max:36.9 C (99991111 F)      Systolic (123XX123), [id number] , Min:140 , [id number]     Diastolic (123XX123), 123XX123, Min:78, Max:89    Temp  Avg: 36.7 C (98.1 F)  Min: 36.5 C (97.7 F)  Max: 36.9 C (98.4 F)  Pulse  Avg: 79.8  Min: 77  Max: 85  Resp  Avg: 18  Min: 18  Max: 18  SpO2  Avg: 93 %  Min: 90 %  Max: 96 %  Pain Score (Numeric, Faces): 0    Today's Physical Exam:  Temperature: 36.5 C (97.7 F)  Heart Rate: 85  BP (Non-Invasive): (!) 144/89  Respiratory Rate: 18  SpO2-1: 90 %  Pain Score (Numeric, Faces): 0  CVS: Regular rate and rhythm  LUNGS: Clear to auscultation  ABD:  Soft,  functional ileostomy, no hernias palpated.    I/O:  I/O last 24 hours:    Intake/Output Summary (Last 24 hours) at 04/08/16 1822  Last data filed at 04/08/16 1600   Gross per 24 hour   Intake             2952 ml   Output             3400 ml   Net             -448 ml     I/O current shift:  11/13 1600 - 11/13 2359  In: -   Out: 800 [Urine:400; Stool:400]      Labs:  Results for orders placed or performed during the hospital encounter of 04/06/16 (from the past 24 hour(s))   CBC/DIFF    Narrative    The following orders were created for panel order CBC/DIFF.  Procedure                               Abnormality  Status                     ---------                               -----------         ------                     CBC WITH [id number]                Abnormal            Final result                 Please view results for these tests on the individual orders.   BASIC METABOLIC PANEL   Result Value Ref Range    SODIUM 138 136 - 144 mmol/L    POTASSIUM 3.9 3.6 - 5.1 mmol/L    CHLORIDE 103 101 - 111 mmol/L    CO2 TOTAL 27 22 - 32 mmol/L    ANION GAP 8 mmol/L    CALCIUM 8.7 (L) 8.9 - 10.3 mg/dL    GLUCOSE 106 70 - 110 mg/dL    BUN 13 8-20 mg/dL mg/dL    CREATININE 0.80 0.6-1.2 mg/dL mg/dL    BUN/CREA RATIO 16     ESTIMATED GFR 96 Avg: 75 mL/min/1.62m2   MAGNESIUM   Result Value Ref Range    MAGNESIUM 2.2 1.8 - 2.5 mg/dL   PHOSPHORUS   Result Value Ref Range    PHOSPHORUS 3.2 2.4 - 4.7 mg/dL   CBC WITH DIFF   Result Value Ref Range    WBC 8.2 3.3 - 9.3 x103/uL    RBC 4.64 4.40 - 5.68 x106/uL    HGB 15.0 13.4 - 17.3 g/dL    HCT 44.4 38.9 - 50.5 %    MCV 95.8 (H) 82.4 - 95.0 fL    MCH 32.4 27.9 - 33.1 pg    MCHC 33.8 32.8 - 36.0 g/dL    RDW 14.2 10.9 - 15.1 %    PLATELETS 199 140 - 440 x103/uL    MPV 8.2 6.0 - 10.2 fL    NEUTROPHIL % 71 %    LYMPHOCYTE % 11 %    MONOCYTE % 16 %    EOSINOPHIL % 1 %    BASOPHIL % 1 %    NEUTROPHIL # 5.80 (H) 1.60 - 5.50 x103/uL    LYMPHOCYTE # 0.90 0.80 - 3.20 x103/uL      MONOCYTE # 1.30 (H) 0.20 - 0.80 x103/uL    EOSINOPHIL # 0.00 0.00 - 0.50 x103/uL    BASOPHIL # 0.10 0.00 - 0.20 x103/uL         Radiology Tests :  Results for orders placed or performed during the hospital encounter of 04/06/16 (from the past 24 hour(s))   XR KUB AND UPRIGHT ABDOMEN     Status: None    Narrative    Darrell Darrell    PROCEDURE DESCRIPTION: XR KUB AND UPRIGHT ABDOMEN    PROCEDURE PERFORMED DATE AND TIME: 04/08/2016 12:22 PM    CLINICAL INDICATION: Small bowel obustruction.    TECHNIQUE: 2 views / 2 images submitted.    COMPARISON: 04/07/2016      FINDINGS: Supine and upright abdominal views, follow-up small bowel  obstruction reveal Darrell nasogastric tube with the tip projected over the  proximal stomach. Small bowel  distention is better than that seen  previously. Darrell right abdominal ostomy is reidentified and there is no free  air.      Impression    Improved small bowel distention. NG tube in the proximal  stomach.  Darrell Infante, MD

## 2016-04-08 NOTE — Care Plan (Signed)
Problem: Nutrition, Imbalanced: Inadequate Oral Intake (Adult)  Goal: Identify Related Risk Factors and Signs and Symptoms  Related risk factors and signs and symptoms are identified upon initiation of Human Response Clinical Practice Guideline (CPG).  Outcome: Ongoing (see interventions/notes)    Goal: Improved Oral Intake  Patient will demonstrate the desired outcomes by discharge/transition of care.  Outcome: Ongoing (see interventions/notes)    Goal: Prevent Further Weight Loss  Patient will demonstrate the desired outcomes by discharge/transition of care.  Outcome: Ongoing (see interventions/notes)

## 2016-04-08 NOTE — Care Plan (Signed)
Problem: Bowel Obstruction (Adult)  Prevent and manage potential problems including: 1. diarrhea 2. electrolyte/acid-base imbalance 3. fluid volume deficit/hypovolemia 4. nausea and vomiting 5. pain 6. peritonitis 7. situational response 8. undernutrition   Goal: Signs and Symptoms of Listed Potential Problems Will be Absent, Minimized or Managed (Bowel Obstruction)  Signs and symptoms of listed potential problems will be absent, minimized or managed by discharge/transition of care (reference Bowel Obstruction (Adult) CPG).   Outcome: Ongoing (see interventions/notes)

## 2016-04-09 DIAGNOSIS — D509 Iron deficiency anemia, unspecified: Secondary | ICD-10-CM

## 2016-04-09 DIAGNOSIS — K433 Parastomal hernia with obstruction, without gangrene: Secondary | ICD-10-CM

## 2016-04-09 DIAGNOSIS — E039 Hypothyroidism, unspecified: Secondary | ICD-10-CM

## 2016-04-09 DIAGNOSIS — I10 Essential (primary) hypertension: Secondary | ICD-10-CM

## 2016-04-09 LAB — BASIC METABOLIC PANEL
ANION GAP: 5 mmol/L
BUN/CREA RATIO: 24
BUN: 19 mg/dL (ref 8–20)
CALCIUM: 8.4 mg/dL — ABNORMAL LOW (ref 8.9–10.3)
CHLORIDE: 109 mmol/L (ref 101–111)
CO2 TOTAL: 26 mmol/L (ref 22–32)
CREATININE: 0.79 mg/dL (ref 0.6–1.2)
ESTIMATED GFR: 97 mL/min/1.73mˆ2
GLUCOSE: 90 mg/dL (ref 70–110)
POTASSIUM: 4.4 mmol/L (ref 3.6–5.1)
SODIUM: 140 mmol/L (ref 136–144)

## 2016-04-09 LAB — CBC WITH DIFF
BASOPHIL #: 0.1 x10ˆ3/uL (ref 0.00–0.20)
BASOPHIL %: 1 %
EOSINOPHIL #: 0.1 x10ˆ3/uL (ref 0.00–0.50)
EOSINOPHIL %: 1 %
HCT: 41.8 % (ref 38.9–50.5)
HGB: 14.1 g/dL (ref 13.4–17.3)
LYMPHOCYTE #: 1.6 x10ˆ3/uL (ref 0.80–3.20)
LYMPHOCYTE %: 16 %
MCH: 32.8 pg (ref 27.9–33.1)
MCHC: 33.8 g/dL (ref 32.8–36.0)
MCV: 97.2 fL — ABNORMAL HIGH (ref 82.4–95.0)
MONOCYTE #: 1.3 10*3/uL — ABNORMAL HIGH (ref 0.20–0.80)
MONOCYTE #: 1.3 x10ˆ3/uL — ABNORMAL HIGH (ref 0.20–0.80)
MONOCYTE %: 13 %
MPV: 8.9 fL (ref 6.0–10.2)
NEUTROPHIL #: 7 x10ˆ3/uL — ABNORMAL HIGH (ref 1.60–5.50)
NEUTROPHIL %: 70 %
PLATELETS: 142 x10ˆ3/uL (ref 140–440)
RBC: 4.3 x10?6/uL — ABNORMAL LOW (ref 4.40–5.68)
RBC: 4.3 x10ˆ6/uL — ABNORMAL LOW (ref 4.40–5.68)
RDW: 13.9 % (ref 10.9–15.1)
WBC: 10 x10ˆ3/uL — ABNORMAL HIGH (ref 3.3–9.3)

## 2016-04-09 LAB — MAGNESIUM: MAGNESIUM: 2.2 mg/dL (ref 1.8–2.5)

## 2016-04-09 LAB — PHOSPHORUS: PHOSPHORUS: 2.4 mg/dL (ref 2.4–4.7)

## 2016-04-09 MED ORDER — LEVOTHYROXINE 50 MCG TABLET
50.00 ug | ORAL_TABLET | Freq: Every morning | ORAL | Status: DC
Start: 2016-04-09 — End: 2016-04-09
  Administered 2016-04-09: 50 ug via ORAL
  Filled 2016-04-09 (×2): qty 1

## 2016-04-09 MED ORDER — AMLODIPINE 2.5 MG TABLET
2.5000 mg | ORAL_TABLET | Freq: Every day | ORAL | Status: DC
Start: 2016-04-09 — End: 2016-04-09
  Administered 2016-04-09: 2.5 mg via ORAL
  Filled 2016-04-09: qty 1

## 2016-04-09 MED ORDER — PANTOPRAZOLE 40 MG TABLET,DELAYED RELEASE
40.00 mg | DELAYED_RELEASE_TABLET | Freq: Every morning | ORAL | Status: DC
Start: 2016-04-10 — End: 2016-04-09
  Filled 2016-04-09: qty 1

## 2016-04-09 NOTE — Care Plan (Signed)
Problem: Bowel Obstruction (Adult)  Prevent and manage potential problems including: 1. diarrhea 2. electrolyte/acid-base imbalance 3. fluid volume deficit/hypovolemia 4. nausea and vomiting 5. pain 6. peritonitis 7. situational response 8. undernutrition   Goal: Signs and Symptoms of Listed Potential Problems Will be Absent, Minimized or Managed (Bowel Obstruction)  Signs and symptoms of listed potential problems will be absent, minimized or managed by discharge/transition of care (reference Bowel Obstruction (Adult) CPG).   Outcome: Ongoing (see interventions/notes)  Pt c/o of no abdominal pain or discomfort throughout the night, ostomy output was normal- per pt. C/o of headache; alleviated with tylenol PO. Pt otherwise rested comfortably and is currently asleep in bed.     Doylene Bode, RN

## 2016-04-09 NOTE — Discharge Summary (Signed)
Family Medicine   [address] [place] [address]  Dept: [phone]  Dept Fax: [phone]    DISCHARGE SUMMARY      Date of Service:  04/09/2016  [name], 70 y.o. male  Date of Birth:  [birth year]  PCP: Jenel Lucks, MD    ADMISSION DATE:  04/06/2016  DISCHARGE DATE:  04/09/2016    ATTENDING PHYSICIAN: Dr. 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 Patient admitted for ileostomy parastomal hernia and possible ileus vs Small bowel obstruction after CT abd showed dilated fluid-filled loops of distal bowel kinked and looped around ostomy site. Dr. Christel Mormon consulted and rec bowel decompression via NG tube. Repeated KUB showed improvement in Small bowel obstruction and subsequently NG tube removed. Patient originally was on clear liquid diet advance to full diet and patient tolerated well. Dr. Rowe Clack rec avoid Lomotil while changing his ostomy and follow up PRN.     CONSULTATIONS: General Surgery          PROCEDURES PERFORMED: NG tube placed.     PHYSICAL EXAM AT DISCHARGE     Temperature: 36.6 C (97.9 F)  Heart Rate: 70  BP (Non-Invasive): 129/87  Respiratory Rate: 18  SpO2-1: 99 %  Pain Score (Numeric, Faces): 0  General: appears in good health  Eyes: Conjunctiva clear.  HEENT:ENMT without erythema or injection, mucous membranes moist.  Neck: no thyromegaly or lymphadenopathy  Lungs: Clear to auscultation bilaterally.   Cardiovascular: regular rate and rhythm  Abdomen: Soft, non-tender  Extremities: No cyanosis or edema  Skin: Skin warm and dry  Neurologic: Grossly normal  Lymphatics: No lymphadenopathy  Psychiatric: Normal    SIGNIFICANT LABS / IMAGING     SIGNIFICANT LAB:   Lab Results for Last 24 Hours:    Results for orders placed or performed during the hospital encounter of 04/06/16 (from the past 24 hour(s))   BASIC METABOLIC PANEL   Result Value Ref Range    SODIUM 140 136 - 144 mmol/L    POTASSIUM 4.4 3.6 - 5.1 mmol/L    CHLORIDE 109 101 - 111 mmol/L    CO2 TOTAL 26 22 - 32 mmol/L    ANION GAP 5 mmol/L    CALCIUM 8.4 (L) 8.9 - 10.3 mg/dL    GLUCOSE 90 70 - 110 mg/dL  BUN 19 8-20 mg/dL mg/dL    CREATININE 0.79 0.6-1.2 mg/dL mg/dL    BUN/CREA RATIO 24     ESTIMATED GFR 97 Avg: 75 mL/min/1.63m2   MAGNESIUM   Result Value Ref Range    MAGNESIUM 2.2 1.8 - 2.5 mg/dL   PHOSPHORUS   Result Value Ref Range    PHOSPHORUS 2.4 2.4 - 4.7 mg/dL   CBC WITH DIFF   Result Value Ref Range    WBC 10.0 (H) 3.3 - 9.3 x103/uL    RBC 4.30 (L) 4.40 - 5.68 x106/uL    HGB 14.1 13.4 - 17.3 g/dL    HCT 41.8 38.9 - 50.5 %    MCV 97.2 (H) 82.4 - 95.0 fL    MCH 32.8 27.9 - 33.1 pg    MCHC 33.8 32.8 - 36.0 g/dL    RDW 13.9 10.9 - 15.1 %    PLATELETS 142 140 - 440 x103/uL    MPV 8.9 6.0 - 10.2 fL    NEUTROPHIL % 70 %    LYMPHOCYTE % 16 %    MONOCYTE % 13 %    EOSINOPHIL % 1 %    BASOPHIL % 1 %    NEUTROPHIL # 7.00 (H) 1.60 - 5.50 x103/uL    LYMPHOCYTE # 1.60 0.80 - 3.20 x103/uL    MONOCYTE # 1.30 (H) 0.20 - 0.80 x103/uL     EOSINOPHIL # 0.10 0.00 - 0.50 x103/uL    BASOPHIL # 0.10 0.00 - 0.20 x103/uL       SIGNIFICANT RADIOLOGY:     KUB: Improved Small Bowel Obstruction.     DISCHARGE MEDS / INSTRUCTIONS     DISCHARGE MEDICATIONS:     Current Discharge Medication List      CONTINUE these medications - NO CHANGES were made during your visit.       Details    amLODIPine 2.5 mg Tablet   Commonly known as:  NORVASC    2.5 mg, Oral, Daily   Qty:  30 Tab   Refills:  5       CALCIUM 600 + D(3) ORAL    Take by mouth   Refills:  0       cholecalciferol (vitamin D3) 1,000 unit Tablet    1,000 Units, Daily   Refills:  0       diazePAM 5 mg Tablet   Commonly known as:  VALIUM    5 mg, Oral, Q6H PRN   Refills:  0       ibuprofen 400 mg Tablet   Commonly known as:  MOTRIN    400 mg, Oral, 4x/day PRN   Refills:  0       loperamide 2 mg Capsule   Commonly known as:  IMODIUM    2 mg, Oral, Q6H PRN   Refills:  0       propranolol 10 mg Tablet   Commonly known as:  INDERAL    10 mg, Oral, See Comment, Take one tab PO every 6 hours as needed for tremor   Qty:  60 Tab   Refills:  3       thyroid 60 mg Tablet   Commonly known as:  ARMOUR THYROID    60 mg, Oral, Daily   Refills:  0             DISCHARGE INSTRUCTIONS:     DISCHARGE INSTRUCTION - DIET   Diet: RESUME HOME DIET      DISCHARGE INSTRUCTION - ACTIVITY  Activity: AS TOLERATED      SCHEDULE FOLLOW-UP [facility] [place]   Follow-up in: 2 WEEKS    Reason for visit: HOSPITAL DISCHARGE    Provider: Jenel Lucks        DISPOSITION AND OUTPATIENT FOLLOW UP     CONDITION ON DISCHARGE: Alert, Oriented and VS Stable    DISCHARGE DISPOSITION:  Home discharge     RECOMMENDATIONS: Please stop taking Lomotil as per Dr. Rowe Clack recommendation.     FOLLOW UP: 2 WK with PCP.    I spent more than 30 minutes discharging this patient, including final assessment, diagnosis and treatment of the patient; discussion of hospital course and discharge instructions with the patient and/or family; and  arrangements for follow-up care, including appointments, needed prescriptions, referrals, and/or preparation of discharge records.      cc: Primary Care Physician:  Jenel Lucks, MD  [place]  [place] [address]     [id number] Physician:  No referring provider defined for this encounter.     04/10/2016  ________________________________________________________________________    Patient seen and examined yesterday, tolerating regular diet without issues.Denies abdominal pain.  I have reviewed the resident's progress note and agree with the findings and plan of care as documented. I have discussed the patient's case and management with the family medicine team. Patient was discharged home follow-up with family medicine clinic. Refer to resident's discharge summary note for further details.  Any exceptions/additions are edited/noted.    Candis Musa, MD 04/10/2016, 14:58  [facility]

## 2016-04-09 NOTE — Nurses Notes (Signed)
Discharge note: Patient being discharged. Instructions went over with patient. No questions/concerns. Violeta Gelinas, RN

## 2016-04-09 NOTE — Care Plan (Signed)
Problem: Patient Care Overview (Adult,OB)  Goal: Plan of Care Review(Adult,OB)  The patient and/or their representative will communicate an understanding of their plan of care   Outcome: Ongoing (see interventions/notes)  Goal: Individualization/Patient Specific Goal(Adult/OB)  Outcome: Ongoing (see interventions/notes)  Goal: Interdisciplinary Rounds/Family Conf  Outcome: Ongoing (see interventions/notes)    Problem: Fall Risk (Adult)  Goal: Absence of Falls  Patient will demonstrate the desired outcomes by discharge/transition of care.   Outcome: Change in Care Plan/more inclusive CPG Date Met:  04/09/16    Problem: Skin Injury Risk (Adult,Obstetrics,Pediatric)  Goal: Skin Health and Integrity  Patient will demonstrate the desired outcomes by discharge/transition of care.   Outcome: Ongoing (see interventions/notes)    Problem: Bowel Obstruction (Adult)  Prevent and manage potential problems including: 1. diarrhea 2. electrolyte/acid-base imbalance 3. fluid volume deficit/hypovolemia 4. nausea and vomiting 5. pain 6. peritonitis 7. situational response 8. undernutrition   Goal: Signs and Symptoms of Listed Potential Problems Will be Absent, Minimized or Managed (Bowel Obstruction)  Signs and symptoms of listed potential problems will be absent, minimized or managed by discharge/transition of care (reference Bowel Obstruction (Adult) CPG).   Outcome: Ongoing (see interventions/notes)    Problem: Nutrition, Imbalanced: Inadequate Oral Intake (Adult)  Goal: Identify Related Risk Factors and Signs and Symptoms  Related risk factors and signs and symptoms are identified upon initiation of Human Response Clinical Practice Guideline (CPG).   Outcome: Ongoing (see interventions/notes)  Goal: Improved Oral Intake  Patient will demonstrate the desired outcomes by discharge/transition of care.   Outcome: Ongoing (see interventions/notes)  Goal: Prevent Further Weight Loss  Patient will demonstrate the desired outcomes by  discharge/transition of care.   Outcome: Ongoing (see interventions/notes)

## 2016-04-09 NOTE — Consults (Signed)
[name]  Surgery Progress Note    [name], 70 y.o. male  Date of Birth:  1945/06/06  Date of Admission:  04/06/2016  Date of service: 04/09/2016    Post Op Day:   S/P      Assessment/ Plan:  Active Hospital Problems    Diagnosis    Primary Problem: Small bowel obstruction    Hypertension    Iron deficiency anemia    SBO (small bowel obstruction)    Hypothyroid     Resolving small bowel obstruction.  Advance to regular diet.  If patient tolerates may discharge home today.  I have recommended that the patient avoid the use of Lomotil while changing his ostomy.  Follow up p.r.n.Darrell Beards, MD    Subjective     Patient without complaints.  Tolerating liquid diet.  Ostomy is functional.  Current Medications:    Current Facility-Administered Medications:  acetaminophen (TYLENOL) tablet 650 mg Oral Q4H PRN   amLODIPine (NORVASC) tablet 2.5 mg Oral Daily   diphenhydrAMINE (BENADRYL) 50 mg/mL injection 25 mg Intravenous NIGHTLY   enoxaparin PF (LOVENOX) 40 mg/0.4 mL SubQ injection 40 mg Subcutaneous Q24H   levothyroxine (SYNTHROID) tablet 50 mcg Oral QAM   NS 10 mL injection 10 mL Intravenous Give in Radiology   NS flush syringe 3 mL Intracatheter Q8HRS   NS flush syringe 3 mL Intracatheter Q1H PRN   NS premix infusion  Intravenous Continuous   ondansetron (ZOFRAN) 2 mg/mL injection 4 mg Intravenous Q6H PRN   [START ON 04/10/2016] pantoprazole (PROTONIX) delayed release tablet 40 mg Oral Daily before Breakfast       Objective     Vital Signs:  Temp (24hrs) Max:37.3 C (123XX123 F)      Systolic (123XX123), [id number] , Min:126 , Q000111Q     Diastolic (123XX123), 0000000, Min:77, Max:89    Temp  Avg: 36.8 C (98.2 F)  Min: 36.5 C (97.7 F)  Max: 37.3 C (99.1 F)  Pulse  Avg: 74.2  Min: 62  Max: 85  Resp  Avg: 17.6  Min: 16  Max: 18  SpO2  Avg: 93.2 %  Min: 90 %  Max: 95 %  Pain Score (Numeric, Faces): 0    Today's Physical Exam:  Temperature: 36.9 C (98.4 F)  Heart Rate: 62  BP (Non-Invasive):  130/86  Respiratory Rate: 18  SpO2-1: 95 %  Pain Score (Numeric, Faces): 0  CVS: Regular rate and rhythm  LUNGS: Clear to auscultation  ABD:  Gas and stool within ostomy bag.  Abdomen is soft, nontender.    I/O:  I/O last 24 hours:    Intake/Output Summary (Last 24 hours) at 04/09/16 1205  Last data filed at 04/09/16 1000   Gross per 24 hour   Intake              240 ml   Output             2750 ml   Net            -2510 ml     I/O current shift:  11/14 0800 - 11/14 1559  In: 240 [P.O.:240]  Out: 625 [Urine:400; Stool:225]      Labs:  Results for orders placed or performed during the hospital encounter of 04/06/16 (from the past 24 hour(s))   CBC/DIFF    Narrative    The following orders were created for panel order CBC/DIFF.  Procedure  Abnormality         Status                     ---------                               -----------         ------                     CBC WITH DIFF[[id number]]                Abnormal            Final result                 Please view results for these tests on the individual orders.   BASIC METABOLIC PANEL   Result Value Ref Range    SODIUM 140 136 - 144 mmol/L    POTASSIUM 4.4 3.6 - 5.1 mmol/L    CHLORIDE 109 101 - 111 mmol/L    CO2 TOTAL 26 22 - 32 mmol/L    ANION GAP 5 mmol/L    CALCIUM 8.4 (L) 8.9 - 10.3 mg/dL    GLUCOSE 90 70 - 110 mg/dL    BUN 19 8-20 mg/dL mg/dL    CREATININE 0.79 0.6-1.2 mg/dL mg/dL    BUN/CREA RATIO 24     ESTIMATED GFR 97 Avg: 75 mL/min/1.31m2   MAGNESIUM   Result Value Ref Range    MAGNESIUM 2.2 1.8 - 2.5 mg/dL   PHOSPHORUS   Result Value Ref Range    PHOSPHORUS 2.4 2.4 - 4.7 mg/dL   CBC WITH DIFF   Result Value Ref Range    WBC 10.0 (H) 3.3 - 9.3 x103/uL    RBC 4.30 (L) 4.40 - 5.68 x106/uL    HGB 14.1 13.4 - 17.3 g/dL    HCT 41.8 38.9 - 50.5 %    MCV 97.2 (H) 82.4 - 95.0 fL    MCH 32.8 27.9 - 33.1 pg    MCHC 33.8 32.8 - 36.0 g/dL    RDW 13.9 10.9 - 15.1 %    PLATELETS 142 140 - 440 x103/uL    MPV 8.9 6.0 - 10.2 fL     NEUTROPHIL % 70 %    LYMPHOCYTE % 16 %    MONOCYTE % 13 %    EOSINOPHIL % 1 %    BASOPHIL % 1 %    NEUTROPHIL # 7.00 (H) 1.60 - 5.50 x103/uL    LYMPHOCYTE # 1.60 0.80 - 3.20 x103/uL    MONOCYTE # 1.30 (H) 0.20 - 0.80 x103/uL    EOSINOPHIL # 0.10 0.00 - 0.50 x103/uL    BASOPHIL # 0.10 0.00 - 0.20 x103/uL         Radiology Tests :  Results for orders placed or performed during the hospital encounter of 04/06/16 (from the past 24 hour(s))   XR KUB AND UPRIGHT ABDOMEN     Status: None    Narrative    [name]    PROCEDURE DESCRIPTION: XR KUB AND UPRIGHT ABDOMEN    PROCEDURE PERFORMED DATE AND TIME: 04/08/2016 12:22 PM    CLINICAL INDICATION: Small bowel obustruction.    TECHNIQUE: 2 views / 2 images submitted.    COMPARISON: 04/07/2016      FINDINGS: Supine and upright abdominal views, follow-up small bowel  obstruction reveal [name] nasogastric tube with  the tip projected over the  proximal stomach. Small bowel distention is better than that seen  previously. [name] right abdominal ostomy is reidentified and there is no free  air.      Impression    Improved small bowel distention. NG tube in the proximal  stomach.  Darrell Infante, MD

## 2016-04-10 ENCOUNTER — Other Ambulatory Visit (HOSPITAL_BASED_OUTPATIENT_CLINIC_OR_DEPARTMENT_OTHER): Payer: Self-pay

## 2016-04-10 DIAGNOSIS — Z7189 Other specified counseling: Secondary | ICD-10-CM

## 2016-04-10 NOTE — Progress Notes (Signed)
NAME:  [name]  DOB:  07-Dec-1945  AGE:  70 y.o.  MRN:  [id number]    Contact Made: 04/10/2016    Patient discharged from [facility] on 04/09/2016.  Patient is scheduled for a hospital follow up visit on 04/25/2016 with Dr. Humphrey Rolls. Discharge diagnosis: SBO.  There was one medication change during the patient's hospitalization including: stopping Lomotil.      The patient denies any problems since discharge and confirms intention to present for hospital follow up visit as scheduled.      The patient received discharge instructions in the hospital to follow up with the following specialist: surgery as needed.     The patient did not have home health ordered.      The patient was encouraged to call the [place] [facility] with any new or worsening symptoms and reminded to call the clinic during office hours or to call the hospital to page the resident on call after office hours.      TCM Flowsheet Completed:   Transitional Care Management Contact Type Phone Call Attempt Prescriptions filled? Confirmed Time/Date/Place of Appointment? Is appointment within 14 days of discharge? DME Equipment?   04/10/2016 Telephone Answer 1 N/A Yes Yes N/A      Transitional Care Management (Continued) Home Health Ordered?   04/10/2016 No        Adelene Amas, RN CM  Disease Management Coordinator

## 2016-04-12 ENCOUNTER — Inpatient Hospital Stay (HOSPITAL_BASED_OUTPATIENT_CLINIC_OR_DEPARTMENT_OTHER): Payer: Self-pay | Admitting: Family Medicine

## 2016-04-25 ENCOUNTER — Inpatient Hospital Stay (HOSPITAL_BASED_OUTPATIENT_CLINIC_OR_DEPARTMENT_OTHER): Payer: Self-pay | Admitting: GENERAL

## 2016-07-13 LAB — ENTER/EDIT EXTERNAL COMMON LAB RESULTS
25-HYDROXY VITAMIN D: 55.3
ALBUMIN (SERUM): 4.4
ALKALINE PHOSPHATASE: 38
ALT (SGPT): 19
AST (SGOT): 18
BUN: 15
CALCIUM: 9.5
CHLORIDE: 102
CHOLESTEROL: 178
CREATININE: 0.81
GLUCOSE, FASTING: 95
HCT: 45.8
HDL-CHOLESTEROL: 50
HEMOGLOBIN A1C: 5.4
HGB: 15.1
IRON: 91
MAGNESIUM: 2.1
PHOSPHORUS: 2.9
PLATELET COUNT: 244
POTASSIUM: 4.8
SODIUM: 140
THYROXINE, TOTAL T4: 7.2
TRIGLYCERIDES: 91
TSH: 1.71
VITAMIN B12: 363
WBC: 6

## 2016-08-12 ENCOUNTER — Other Ambulatory Visit (HOSPITAL_BASED_OUTPATIENT_CLINIC_OR_DEPARTMENT_OTHER): Payer: Self-pay | Admitting: Family Medicine

## 2016-08-12 MED ORDER — AMLODIPINE 2.5 MG TABLET
2.5000 mg | ORAL_TABLET | Freq: Every day | ORAL | 5 refills | Status: DC
Start: 2016-08-12 — End: 2016-12-04

## 2016-08-13 ENCOUNTER — Other Ambulatory Visit (INDEPENDENT_AMBULATORY_CARE_PROVIDER_SITE_OTHER): Payer: Self-pay

## 2016-08-13 ENCOUNTER — Telehealth (HOSPITAL_BASED_OUTPATIENT_CLINIC_OR_DEPARTMENT_OTHER): Payer: Self-pay | Admitting: Family Medicine

## 2016-08-13 DIAGNOSIS — G250 Essential tremor: Secondary | ICD-10-CM

## 2016-08-13 MED ORDER — PROPRANOLOL 10 MG TABLET
10.0000 mg | ORAL_TABLET | ORAL | 3 refills | Status: AC
Start: 2016-08-13 — End: ?

## 2016-08-13 NOTE — Telephone Encounter (Signed)
ICD-10-CM    1. 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 08/13/2016, 10:12     [facility]  [address] [place] [address]   Dept: [phone]   Dept Fax: [phone]

## 2016-08-13 NOTE — Telephone Encounter (Signed)
-----   Message from [place], [facility] sent at 08/13/2016  8:37 AM EDT -----  Regarding: FW: Medication Renewal Request  Contact: [phone]      ----- Message -----     From: Arna Medici     Sent: 08/13/2016   8:27 AM       To: [facility] Nurse Pool  Subject: FW: Medication Renewal Request                       ----- Message -----     From: [name]     Sent: 08/13/2016   6:48 AM       To: Website Issues  Subject: Medication Renewal Request                       Original authorizing provider: Ollen Bowl, MD    [name] would like a refill of the following medications:  propranolol (INDERAL) 10 mg Oral Tablet Ollen Bowl, MD]    Preferred pharmacy: [place], [place]:

## 2016-09-13 ENCOUNTER — Encounter (HOSPITAL_BASED_OUTPATIENT_CLINIC_OR_DEPARTMENT_OTHER): Payer: Self-pay | Admitting: Family Medicine

## 2016-11-20 ENCOUNTER — Other Ambulatory Visit (HOSPITAL_COMMUNITY): Payer: Self-pay | Admitting: Pulmonary Disease

## 2016-11-20 DIAGNOSIS — M898X9 Other specified disorders of bone, unspecified site: Secondary | ICD-10-CM

## 2016-12-04 ENCOUNTER — Ambulatory Visit: Payer: BLUE CROSS/BLUE SHIELD | Admitting: Family Medicine

## 2016-12-04 ENCOUNTER — Encounter (HOSPITAL_BASED_OUTPATIENT_CLINIC_OR_DEPARTMENT_OTHER): Payer: Self-pay | Admitting: Family Medicine

## 2016-12-04 VITALS — BP 138/86 | HR 64 | Temp 96.4°F | Resp 20 | Ht 71.5 in | Wt 166.4 lb

## 2016-12-04 DIAGNOSIS — Z8739 Personal history of other diseases of the musculoskeletal system and connective tissue: Secondary | ICD-10-CM

## 2016-12-04 DIAGNOSIS — I10 Essential (primary) hypertension: Secondary | ICD-10-CM

## 2016-12-04 DIAGNOSIS — Z6822 Body mass index (BMI) 22.0-22.9, adult: Secondary | ICD-10-CM

## 2016-12-04 DIAGNOSIS — N529 Male erectile dysfunction, unspecified: Secondary | ICD-10-CM

## 2016-12-04 MED ORDER — AMLODIPINE 5 MG TABLET
5.0000 mg | ORAL_TABLET | Freq: Every day | ORAL | 1 refills | Status: AC
Start: 2016-12-04 — End: ?

## 2016-12-04 MED ORDER — SILDENAFIL 50 MG TABLET: 25 mg | Tab | ORAL | 0 refills | 0 days | Status: AC | PRN

## 2016-12-04 NOTE — Progress Notes (Signed)
[facility]  [address]  [place], [place] [address]  Phone: [phone]  Fax: [phone]      Name: [name]  MRN : [id number]  Date of service: 12/04/2016 at  3:00 PM EDT    CHIEF COMPLAINT:  Other (Wants Bone Density Test)    HISTORY OF PRESENT ILLNESS:   [name] is a 71 y.o. male who comes in today for follow up. He is requesting bone density scan. He reports using steroids daily for 25 years due to ulcerative colitis. He is no longer on steroids. Last DEXA scan in 2013 showed osteoporosis T-score 2.5, he reports taking Fosamax for 5 years, and is no longer taking it. 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 He denies any bone pain, recent fractures. He follows with endocrine for hypothyroidism, taking Armour thyroid pills, as he can't tolerate synthroid in the past. Patient brought in his labs done in February 2018, TSH 1.72 within normal limits, electrolytes, testosterone and lipid panel within normal limits.     Patient is requesting viagara prescription. He reports no loss of libido, denies morning erections, and has difficulty gaining and maintaining erections. He denies any chest pain, and is not on nitrates for chest pain. He does not have any significant heart history. He is able to climb 2 flights of stairs with no difficulty, he denies dyspnea on exertion, depressed mood. Testosterone checked in February was normal.      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    Ulcerative colitis (CMS [place]) 03/05/2013     FAMILY HISTORY:  Family Medical History     None        SOCIAL HISTORY:  Social History     Social History    Marital status: Married      Spouse name: N/A    Number of children: N/A    Years of education: N/A     Occupational History     [employer]     Social History Main Topics    Smoking status: Never Smoker    Smokeless tobacco: Never Used    Alcohol use No    Drug use: No    Sexual activity: Yes     Partners: Female     Other Topics Concern    Not on file     Social History Narrative     MEDICATIONS:  Current Outpatient Prescriptions   Medication Sig    amLODIPine (NORVASC) 5 mg Oral Tablet Take 1 Tab (5 mg total) by mouth Once a day    CALCIUM CARBONATE/VITAMIN D3 (CALCIUM 600 + D,3, ORAL) Take by mouth    cholecalciferol, vitamin D3, 1,000 unit Oral Tablet Take 1,000 Units by mouth Once a day    diazePAM (VALIUM) 5 mg Oral Tablet Take 5 mg by mouth Every 6 hours as needed for Anxiety    ibuprofen (MOTRIN) 400 mg Oral Tablet Take 400 mg by mouth Four times a day as needed for Pain    propranolol (INDERAL) 10 mg Oral Tablet Take 1 Tab (10 mg total) by mouth Per  instructions Take one tab PO every 6 hours as needed for tremor    Sildenafil (VIAGRA) 50 mg Oral Tablet Take 0.5 Tabs (25 mg total) by mouth Every 24 hours as needed    thyroid (ARMOUR THYROID) 60 mg Oral Tablet Take 60 mg by mouth Once a day    [DISCONTINUED] amLODIPine (NORVASC) 2.5 mg Oral Tablet Take 1 Tab (2.5 mg total) by mouth Once a day (Patient taking differently: Take 5 mg by mouth Once a day )     ALLERGIES:  No Known Allergies    Review of Systems:  Positive for erectile dysfunction. Negative for bone pain, recent fractures. All other systems reviewed and negative.     PHYSICAL EXAM:  BP 138/86   Pulse 64   Temp 35.8 C (96.4 F)   Resp 20   Ht 1.816 m (5' 11.5")   Wt 75.5 kg (166 lb 6.4 oz)   SpO2 96%   BMI 22.88 kg/m2   GEN: cooperative, no distress, appears stated age. Patient seen on exam table answering questions appropriately.  PSYCH: Normal affect and mood, No SI, No HI  HEENT: NC/AT, PERRLA B/L, EOMI B/L, conjunctivae clear, no nasal  discharge, non-erythematous; lips, mucosa and tongue normal  NECK: no cervical/supraclavicular lymphadenopathy, trachea midline  BACK: symmetrical, ROM normal  LUNGS: CTA B/L, no wheezes, no crackles, no rhonchi, normal effort  HEART: Regular rate and rhythm, S1 and S2 normal, no murmurs, no edema  ABDOMEN: soft, non-tender, non-distended, bowel sounds present, no organomegaly  EXTREMITIES: atraumatic, no cyanosis, 2+ dorsalis pedis pulses bilaterally     NEURO: Alert and oriented x3, grossly normal, no focal deficits observed  SKIN: no rashes or lesions    ASSESSMENT AND PLAN:    ICD-10-CM    1. History of osteoporosis  - DEXA order placed Z87.39 Dexa Scan   2. Hypertension  BP Readings from Last 4 Encounters:   12/04/16 138/86   04/09/16 129/87   03/22/16 126/80   08/09/15 134/90     - Well controlled, Rx refill sent to pharmacy, patient is feeling well and denies any symptoms referable to hypertension. Will continue to monitor and make changes if necessary.  - Patient was adequately educated as to a low salt and healthy diet, was encouraged to exercise, and to go to the ED if experiencing blurring of vision, sob, chest pain, or SOB. I10 amLODIPine (NORVASC) 5 mg Oral Tablet   3. Erectile dysfunction, unspecified erectile dysfunction type  - Patient denies any chest pain, and is not on nitrates for chest pain. He does not have any significant heart history.  Will start the patient on Viagra.  Instructed him to take half tab, 25 mg about 0.5 hr to 4 hr for sexual activity.     - I did explain that taking this medication could increase the event of low blood pressure, heart attack, and/or stroke and/or priapism.  Patient verbalized understanding and is still willing to give the medication a try.  I explained that should he have any adverse effects then he should contact us immediately or go straight to the ER.  Patient verbalized understanding is agreeable with plan.    - He is informed that Jerilynn Som is usually not  covered by insurance. It is available on a fee-for-service cost basis, and is relatively expensive. The method of use 1 hour prior to anticipated intercourse is explained. He should not use any more than one tablet in a 24-hour period. The side effects of  possible headache, flushing, dyspepsia and transient changes in vision have been explained.     - The patient is not taking nitrates, and denies he has access to nitrates in any form at any time. I have counseled him that taking Viagara with nitrates of any form can cause death. Additionally, Viagara serum concentrations can be increased by the following: cimetidine, erythromycin, itraconazole or ketoconazole. This patient does not take these drugs, but I have counseled him to avoid Jerilynn Som if he does take any of these.    - We have also discussed the fact that there have been some deaths in patients after taking Viagara, felt due to the exertion of intercourse rather than the drug itself. The patient is aware of this, and accepts whatever unknown degree of risk there is in this aspect. N52.9 Sildenafil (VIAGRA) 50 mg Oral Tablet     Orders Placed This Encounter    Dexa Scan    amLODIPine (NORVASC) 5 mg Oral Tablet    Sildenafil (VIAGRA) 50 mg Oral Tablet     FOLLOW UP:  Return in about 3 months (around 03/06/2017) for f/u.    The patient was given ample opportunity to ask questions and those questions were answered to the patient's satisfaction. The patient was encouraged to be involved in their own care, and all diagnoses, medications, and medication side-effects were discussed. The patient was told to contact me with any additional questions or concerns, and to go to the emergency department in an emergency.     Jenel Lucks, M.D. 12/04/2016, 17:14     [facility]  [address] [place] [address]   Phone: [phone]   Fax: [phone]      Supervising Physician's Statement    History/Reason for today's visit: As stated in  above note.  Exam: As per resident note.  Assessment/Plan: As per resident note.  Preceptor for this encounter: Dr. Rodell Perna and prior to or immediately following the patient's discharge today, I reviewed and concur with the history, examination, tests ordered, diagnosis, and plan of the resident physician.    Rodell Perna, MD

## 2016-12-09 ENCOUNTER — Other Ambulatory Visit (HOSPITAL_BASED_OUTPATIENT_CLINIC_OR_DEPARTMENT_OTHER): Payer: Self-pay

## 2016-12-12 ENCOUNTER — Other Ambulatory Visit (HOSPITAL_BASED_OUTPATIENT_CLINIC_OR_DEPARTMENT_OTHER): Payer: Self-pay | Admitting: Family Medicine

## 2016-12-12 ENCOUNTER — Ambulatory Visit
Admission: RE | Admit: 2016-12-12 | Discharge: 2016-12-12 | Disposition: A | Discharge status: Home / Self Care | Payer: BLUE CROSS/BLUE SHIELD | Source: Ambulatory Visit | Attending: Family Medicine | Admitting: Family Medicine

## 2016-12-12 DIAGNOSIS — M8588 Other specified disorders of bone density and structure, other site: Secondary | ICD-10-CM | POA: Insufficient documentation

## 2016-12-12 DIAGNOSIS — Z13820 Encounter for screening for osteoporosis: Secondary | ICD-10-CM | POA: Insufficient documentation

## 2016-12-12 DIAGNOSIS — Z8739 Personal history of other diseases of the musculoskeletal system and connective tissue: Secondary | ICD-10-CM

## 2016-12-19 ENCOUNTER — Telehealth (HOSPITAL_BASED_OUTPATIENT_CLINIC_OR_DEPARTMENT_OTHER): Payer: Self-pay | Admitting: Family Medicine

## 2016-12-19 NOTE — Telephone Encounter (Signed)
Called [name] on 12/19/2016 at 11:53 and informed patient of DEXA scan results, osteopenia borderline, advised to continue with vitamin D, calcium supplements and doing weight-bearing exercises. Plan to follow up DEXA scasn in 1-2 years. Patient denies any complaints and is agreeable with plan.    Told patient to call [place] office if pt has any further questions or concerns.     Jenel Lucks, MD  12/19/2016, 11:53

## 2017-02-16 ENCOUNTER — Encounter (HOSPITAL_COMMUNITY): Payer: Self-pay

## 2017-02-16 ENCOUNTER — Inpatient Hospital Stay (HOSPITAL_COMMUNITY): Payer: BLUE CROSS/BLUE SHIELD | Admitting: Family Medicine

## 2017-02-16 ENCOUNTER — Inpatient Hospital Stay
Admission: EM | Admit: 2017-02-16 | Discharge: 2017-02-18 | DRG: 386 | Disposition: A | Discharge status: Home / Self Care | Payer: BLUE CROSS/BLUE SHIELD | Attending: Family Medicine | Admitting: Family Medicine

## 2017-02-16 ENCOUNTER — Emergency Department (HOSPITAL_COMMUNITY): Payer: BLUE CROSS/BLUE SHIELD

## 2017-02-16 DIAGNOSIS — K5190 Ulcerative colitis, unspecified, without complications: Secondary | ICD-10-CM

## 2017-02-16 DIAGNOSIS — G250 Essential tremor: Secondary | ICD-10-CM

## 2017-02-16 DIAGNOSIS — K922 Gastrointestinal hemorrhage, unspecified: Secondary | ICD-10-CM

## 2017-02-16 DIAGNOSIS — K51812 Other ulcerative colitis with intestinal obstruction: Principal | ICD-10-CM | POA: Diagnosis present

## 2017-02-16 DIAGNOSIS — Z85038 Personal history of other malignant neoplasm of large intestine: Secondary | ICD-10-CM

## 2017-02-16 DIAGNOSIS — Z79899 Other long term (current) drug therapy: Secondary | ICD-10-CM

## 2017-02-16 DIAGNOSIS — R3121 Asymptomatic microscopic hematuria: Secondary | ICD-10-CM

## 2017-02-16 DIAGNOSIS — Z932 Ileostomy status: Secondary | ICD-10-CM

## 2017-02-16 DIAGNOSIS — I10 Essential (primary) hypertension: Secondary | ICD-10-CM

## 2017-02-16 DIAGNOSIS — K56600 Partial intestinal obstruction, unspecified as to cause: Secondary | ICD-10-CM

## 2017-02-16 DIAGNOSIS — F329 Major depressive disorder, single episode, unspecified: Secondary | ICD-10-CM

## 2017-02-16 DIAGNOSIS — E039 Hypothyroidism, unspecified: Secondary | ICD-10-CM

## 2017-02-16 DIAGNOSIS — K56609 Unspecified intestinal obstruction, unspecified as to partial versus complete obstruction: Secondary | ICD-10-CM | POA: Diagnosis present

## 2017-02-16 DIAGNOSIS — K228 Other specified diseases of esophagus: Secondary | ICD-10-CM | POA: Diagnosis present

## 2017-02-16 DIAGNOSIS — M810 Age-related osteoporosis without current pathological fracture: Secondary | ICD-10-CM | POA: Diagnosis present

## 2017-02-16 DIAGNOSIS — R140 Abdominal distension (gaseous): Secondary | ICD-10-CM

## 2017-02-16 DIAGNOSIS — K435 Parastomal hernia without obstruction or  gangrene: Secondary | ICD-10-CM

## 2017-02-16 DIAGNOSIS — F419 Anxiety disorder, unspecified: Secondary | ICD-10-CM | POA: Diagnosis present

## 2017-02-16 LAB — URINE DRUG SCREEN-PAIN CLINIC (NO THC)
CANNABINOIDS URINE: NEGATIVE
OPIATES URINE: NEGATIVE
PCP URINE: NEGATIVE

## 2017-02-16 LAB — BASIC METABOLIC PANEL
ANION GAP: 9 mmol/L
ANION GAP: 9 mmol/L
BUN/CREA RATIO: 26
BUN: 21 mg/dL (ref 10–25)
BUN: 21 mg/dL (ref 10–25)
CALCIUM: 10.1 mg/dL (ref 8.2–10.2)
CHLORIDE: 99 mmol/L (ref 98–111)
CO2 TOTAL: 29 mmol/L (ref 21–35)
CREATININE: 0.8 mg/dL (ref <=1.30)
ESTIMATED GFR: >60 mL/min/1.73mˆ2
GLUCOSE: 144 mg/dL — ABNORMAL HIGH (ref 70–110)
POTASSIUM: 4 mmol/L (ref 3.5–5.0)
SODIUM: 137 mmol/L (ref 135–145)

## 2017-02-16 LAB — URINALYSIS, MACRO/MICRO
BILIRUBIN: NEGATIVE mg/dL
BLOOD: NEGATIVE mg/dL
GLUCOSE: NEGATIVE mg/dL
LEUKOCYTES: NEGATIVE WBCs/uL
NITRITE: NEGATIVE
PH: 6 (ref 5.0–7.0)
SPECIFIC GRAVITY: 1.022 (ref 1.010–1.025)
UROBILINOGEN: 1 mg/dL

## 2017-02-16 LAB — CBC WITH DIFF
BASOPHIL #: 0 x10ˆ3/uL (ref 0.00–0.20)
BASOPHIL %: 0 %
EOSINOPHIL #: 0 x10ˆ3/uL (ref 0.00–0.50)
EOSINOPHIL %: 0 %
HCT: 47.5 % (ref 38.9–50.5)
HGB: 16.2 g/dL (ref 13.4–17.3)
LYMPHOCYTE #: 0.7 x10ˆ3/uL — ABNORMAL LOW (ref 0.80–3.20)
LYMPHOCYTE %: 8 %
LYMPHOCYTE %: 8 %
MCH: 32.6 pg (ref 27.9–33.1)
MCHC: 34.1 g/dL (ref 32.8–36.0)
MCHC: 34.1 g/dL (ref 32.8–36.0)
MCV: 95.7 fL — ABNORMAL HIGH (ref 82.4–95.0)
MONOCYTE #: 1.1 x10ˆ3/uL — ABNORMAL HIGH (ref 0.20–0.80)
MONOCYTE %: 11 %
MONOCYTE %: 11 %
MPV: 8 fL (ref 6.0–10.2)
NEUTROPHIL #: 7.6 x10ˆ3/uL — ABNORMAL HIGH (ref 1.60–5.50)
NEUTROPHIL %: 81 %
NEUTROPHIL %: 81 %
PLATELETS: 236 x10ˆ3/uL (ref 140–440)
RBC: 4.97 x10ˆ6/uL (ref 4.40–5.68)
RDW: 13.8 % (ref 10.9–15.1)
WBC: 9.4 x10ˆ3/uL — ABNORMAL HIGH (ref 3.3–9.3)

## 2017-02-16 LAB — HEPATIC FUNCTION PANEL
ALBUMIN: 4.5 g/dL (ref 3.2–4.6)
ALKALINE PHOSPHATASE: 52 U/L (ref 20–130)
ALT (SGPT): 15 U/L (ref <=52)
AST (SGOT): 15 U/L (ref <=35)
BILIRUBIN DIRECT: 0.2 mg/dL (ref <=0.3)
BILIRUBIN TOTAL: 1.2 mg/dL (ref 0.3–1.2)
PROTEIN TOTAL: 7.7 g/dL (ref 6.0–8.3)

## 2017-02-16 LAB — URINE DRUG SCREEN
AMPHETAMINES URINE: NEGATIVE
BARBITURATES URINE: NEGATIVE
BENZODIAZEPINES URINE: POSITIVE — AB
COCAINE METABOLITES URINE: NEGATIVE

## 2017-02-16 LAB — LIPASE: LIPASE: 31 U/L (ref <=82)

## 2017-02-16 LAB — LACTIC ACID LEVEL: LACTIC ACID: 1.3 mmol/L (ref <2.0)

## 2017-02-16 MED ORDER — SODIUM CHLORIDE 0.9 % (FLUSH) INJECTION SYRINGE
3.0000 mL | INJECTION | Freq: Three times a day (TID) | INTRAMUSCULAR | Status: DC
Start: 2017-02-16 — End: 2017-02-18
  Administered 2017-02-16 – 2017-02-18 (×7): 0 mL

## 2017-02-16 MED ORDER — HYDRALAZINE 20 MG/ML INJECTION SOLUTION
10.00 mg | Freq: Four times a day (QID) | INTRAMUSCULAR | Status: DC | PRN
Start: 2017-02-16 — End: 2017-02-17
  Filled 2017-02-16: qty 1

## 2017-02-16 MED ORDER — SODIUM CHLORIDE 0.9 % (FLUSH) INJECTION SYRINGE
3.0000 mL | INJECTION | INTRAMUSCULAR | Status: DC | PRN
Start: 2017-02-16 — End: 2017-02-18

## 2017-02-16 MED ORDER — DIATRIZOATE MEGLUMINE-DIATRIZOATE SODIUM 66 %-10 % ORAL SOLUTION
ORAL | Status: AC
Start: 2017-02-16 — End: 2017-02-16
  Filled 2017-02-16: qty 30

## 2017-02-16 MED ORDER — ONDANSETRON HCL (PF) 4 MG/2 ML INJECTION SOLUTION
4.0000 mg | Freq: Four times a day (QID) | INTRAMUSCULAR | Status: DC | PRN
Start: 2017-02-16 — End: 2017-02-18
  Administered 2017-02-16 – 2017-02-17 (×2): 4 mg via INTRAVENOUS
  Filled 2017-02-16 (×2): qty 2

## 2017-02-16 MED ORDER — PANTOPRAZOLE 40 MG INTRAVENOUS SOLUTION
80.0000 mg | Freq: Once | INTRAVENOUS | Status: AC
Start: 2017-02-16 — End: 2017-02-16
  Filled 2017-02-16: qty 20

## 2017-02-16 MED ORDER — SODIUM CHLORIDE 0.9 % IV BOLUS
1000.0000 mL | INJECTION | Status: AC
Start: 2017-02-16 — End: 2017-02-16
  Administered 2017-02-16: 1000 mL via INTRAVENOUS
  Administered 2017-02-16: 0 mL via INTRAVENOUS

## 2017-02-16 MED ORDER — SODIUM CHLORIDE 0.9 % INTRAVENOUS SOLUTION
INTRAVENOUS | Status: DC
Start: 2017-02-16 — End: 2017-02-18
  Filled 2017-02-16 (×5): qty 20

## 2017-02-16 MED ORDER — SODIUM CHLORIDE 0.9 % INTRAVENOUS SOLUTION
INTRAVENOUS | Status: DC
Start: 2017-02-16 — End: 2017-02-18

## 2017-02-16 MED ORDER — DIATRIZOATE MEGLUMINE-DIATRIZOATE SODIUM 66 %-10 % ORAL SOLUTION
15.00 mL | ORAL | Status: DC
Start: 2017-02-16 — End: 2017-02-18
  Administered 2017-02-16: 15 mL via ORAL

## 2017-02-16 MED ADMIN — Medication: INTRAVENOUS | @ 07:00:00

## 2017-02-16 NOTE — H&P (Signed)
Family Medicine     History and Physical    [name], 71 y.o. male  Date of Admission:  02/16/2017  Date of Birth:  08/17/1945    Information Obtained from: patient  Chief Complaint: Nausea and vomiting    HPI: [name] is [name] 71 y.o., White male who presents with nausea and vomiting. Patient is [name] complicated medical history significant for ulcerative colitis, colon cancer undergoing surgical resection approximately 3-4 years ago with Dr. Rowe Clack, ileostomy placement since that time, and history of partial small-bowel obstruction.  Approximately 24-36 hr ago, patient began to experience sudden onset of nausea and vomiting. Was unable to tolerate p.o. Denied any significant abdominal pain. Occurred spontaneously while he was doing his daily activities. Denies any fever or chills.  Denies any dysuria hematuria. Noted that his emesis was slightly dark. Notice decreased ostomy output but did still have ostomy output. Came to the emergency room for evaluation. Of note, patient had [name] similar presentation in February 2018. He is history of circumferential ostomy hernia.    In emergency room, patient was noted on KUB to have [name] partial small-bowel obstruction. Urinalysis shows trace ketones and trace protein. Lactic acid was unremarkable. Mild leukocytosis was noted. LFTs were within normal limits.  Kidney function was stable. Patient's emesis was tested for heme and found to be positive.  Patient had an NG-tube placed on intermittent suction and [name] Protonix drip started.  Vitals were stable knee emergency room other than slight hypertension. Patient was given [name] 1 L normal saline bolus.    Past Medical History:   Diagnosis Date   . Colon cancer (CMS HCC)    . H/O colon cancer, stage I    . 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 Hypothyroid    . Osteoporosis    . 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 Ulcerative colitis (CMS [place]) 03/05/2013         Past Surgical History:   Procedure Laterality Date   .  COLONOSCOPY     . HX OTHER  2015    Proctolectomy    . HX SMALL BOWEL RESECTION  2010   . ILEOSTOMY           Medications Prior to Admission     Prescriptions    amLODIPine (NORVASC) 5 mg Oral Tablet    Take 1 Tab (5 mg total) by mouth Once [name] day    CALCIUM CARBONATE/VITAMIN D3 (CALCIUM 600 + D,3, ORAL)    Take by mouth    cholecalciferol, vitamin D3, 1,000 unit Oral Tablet    Take 1,000 Units by mouth Once [name] day    diazePAM (VALIUM) 5 mg Oral Tablet    Take 5 mg by mouth Every 6 hours as needed for Anxiety    ibuprofen (MOTRIN) 400 mg Oral Tablet    Take 400 mg by mouth Four times [name] day as needed for Pain    propranolol (INDERAL) 10 mg Oral Tablet    Take 1 Tab (10 mg total) by mouth Per instructions Take one tab PO every 6 hours as needed for tremor    Sildenafil (VIAGRA) 50 mg Oral Tablet    Take 0.5 Tabs (25 mg total) by mouth Every 24 hours as needed    thyroid (ARMOUR THYROID) 60 mg Oral Tablet    Take 60 mg by mouth Once [name] day        No Known Allergies  Social History  Substance Use Topics   . Smoking status: Never Smoker   . Smokeless tobacco: Never Used   . Alcohol use No     Family History: Noncontributory        ROS: Other than ROS in the HPI, all other systems were negative.    EXAM:  Temperature: 36.7 C (98.1 F)  Heart Rate: 81  BP (Non-Invasive): (!) 150/88  Respiratory Rate: 18  SpO2-1: 94 %  Pain Score (Numeric, Faces): 0  Constitutional: appears chronically ill, appears stated age, no distress and vital signs reviewed and discussed with patient  Eyes: Conjunctiva clear, Pupils equal and round, reactive to light and accomodation, Sclera non-icteric.   ENT: Oral mucosa pink and moist, no obvious oral lesions, minimal erythema in the posterior pharynx without bleeding or exudate, uvula midline, no obvious sinus tenderness, no rhinorrhea  Neck: No obvious cervical lymphadenopathy or thyromegaly  Respiratory: Minimal faint crackles in the left lower lung lobe, otherwise clear throughout, no wheeze or  rhonchi, no respiratory distress  Cardiovascular: regular rate and rhythm, S1, S2 normal, no murmurs  Gastrointestinal: Abdomen soft, no rigidity, no rebound, no guarding, ileostomy noted without significant abnormality, abdominal binder in place, midline abdominal hernia noted that is nontender to palpation, no tenderness throughout, no distension, easily reducible  Musculoskeletal: 5/5 muscle strength for major muscle groups in upper lower extremities for flexion, extension, dorsiflexion, plantar flexion, grip strength  Integumentary: Skin warm and dry, No rashes and No lesions  Neurologic: CN II - XII grossly intact , Alert and oriented x3, Minimal intermittent bilateral upper extremity tremor, sensation to light touch intact in all 4 extremities  Psychiatric: Normal affect, behavior, memory, thought content, judgement, and speech.    LABS:   Results for orders placed or performed during the hospital encounter of 02/16/17 (from the past 24 hour(s))   BASIC METABOLIC PANEL   Result Value Ref Range    SODIUM 137 135 - 145 mmol/L    POTASSIUM 4.0 3.5 - 5.0 mmol/L    CHLORIDE 99 98 - 111 mmol/L    CO2 TOTAL 29 21 - 35 mmol/L    ANION GAP 9 mmol/L    CALCIUM 10.1 8.2 - 10.2 mg/dL    GLUCOSE 144 (H) 70 - 110 mg/dL    BUN 21 10 - 25 mg/dL    CREATININE 0.80 <=1.30 mg/dL    BUN/CREA RATIO 26     ESTIMATED GFR >60 Avg: 75 mL/min/1.53m^2   CBC/DIFF    Narrative    The following orders were created for panel order CBC/DIFF.  Procedure                               Abnormality         Status                     ---------                               -----------         ------                     CBC WITH DIFF[[id number]]                Abnormal            Final result  Please view results for these tests on the individual orders.   LIPASE   Result Value Ref Range    LIPASE 31 <=82 U/L   HEPATIC FUNCTION PANEL   Result Value Ref Range    ALBUMIN 4.5 3.2 - 4.6 g/dL    ALKALINE PHOSPHATASE 52 20 - 130 U/L    ALT  (SGPT) 15 <=52 U/L    AST (SGOT) 15 <=35 U/L    BILIRUBIN TOTAL 1.2 0.3 - 1.2 mg/dL    BILIRUBIN DIRECT 0.2 <=0.3 mg/dL    PROTEIN TOTAL 7.7 6.0 - 8.3 g/dL   LACTIC ACID   Result Value Ref Range    LACTIC ACID 1.3 <2.0 mmol/L   URINALYSIS WITH REFLEX MICROSCOPIC AND CULTURE IF POSITIVE    Narrative    The following orders were created for panel order URINALYSIS WITH REFLEX MICROSCOPIC AND CULTURE IF POSITIVE.  Procedure                               Abnormality         Status                     ---------                               -----------         ------                     URINALYSIS, MACRO/MICRO[[id number]]      Abnormal            Final result                 Please view results for these tests on the individual orders.   CBC WITH DIFF   Result Value Ref Range    WBC 9.4 (H) 3.3 - 9.3 x10^3/uL    RBC 4.97 4.40 - 5.68 x10^6/uL    HGB 16.2 13.4 - 17.3 g/dL    HCT 47.5 38.9 - 50.5 %    MCV 95.7 (H) 82.4 - 95.0 fL    MCH 32.6 27.9 - 33.1 pg    MCHC 34.1 32.8 - 36.0 g/dL    RDW 13.8 10.9 - 15.1 %    PLATELETS 236 140 - 440 x10^3/uL    MPV 8.0 6.0 - 10.2 fL    NEUTROPHIL % 81 %    LYMPHOCYTE % 8 %    MONOCYTE % 11 %    EOSINOPHIL % 0 %    BASOPHIL % 0 %    NEUTROPHIL # 7.60 (H) 1.60 - 5.50 x10^3/uL    LYMPHOCYTE # 0.70 (L) 0.80 - 3.20 x10^3/uL    MONOCYTE # 1.10 (H) 0.20 - 0.80 x10^3/uL    EOSINOPHIL # 0.00 0.00 - 0.50 x10^3/uL    BASOPHIL # 0.00 0.00 - 0.20 x10^3/uL   URINALYSIS, MACRO/MICRO   Result Value Ref Range    COLOR Yellow Yellow, Straw, Colorless    APPEARANCE Clear Clear, Cloudy    SPECIFIC GRAVITY 1.022 1.010 - 1.025    PH 6.0 5.0 - 7.0    LEUKOCYTES Negative Negative WBCs/uL    NITRITE Negative Negative    PROTEIN Small ([name]) Negative, Trace mg/dL    GLUCOSE Negative Negative mg/dL    KETONES Trace ([name]) Negative mg/dL    UROBILINOGEN 1.0 normal, 0.2 ,  1.0 mg/dL    BILIRUBIN Negative Negative mg/dL    BLOOD Negative Negative mg/dL    RBCS 3-10 ([name]) None /hpf    WBCS 0-5 ([name]) None /hpf    BACTERIA None  None /hpf    SQUAMOUS EPITHELIAL None None, Rare, Occasional /hpf    HYALINE CASTS Occasional None, Rare, Few, Occasional, Many /lpf       Radiology Results:   Results for orders placed or performed during the hospital encounter of 02/16/17   XR ABD FLAT AND UPRIGHT SERIES (W PA CHEST)     Status: None    Narrative    [name]    PROCEDURE DESCRIPTION: XR ABD FLAT AND UPRIGHT SERIES (W PA CHEST)    PROCEDURE PERFORMED DATE AND TIME: 02/16/2017 5:25 AM    CLINICAL INDICATION: abd pain    TECHNIQUE: 3 views / 3 images submitted.    COMPARISON: 04/08/2016      FINDINGS: The heart size and pulmonary vasculature appear stable. Chronic  changes are present within the lungs. No focal consolidation or pulmonary  edema.    Right lower quadrant ostomy. Air-fluid levels are present within distended  small bowel loops. Small bowel distention measures up to 4.8 cm within the  central abdomen. Findings raise concern for at least partial small bowel  obstruction. Close follow-up recommended.      Impression    1. Chronic changes within the lungs without focal consolidation or  pulmonary edema.  2. Postoperative changes within the abdomen. Air-fluid levels within  distended small bowel loops, raising concern for partial small bowel  obstruction. Close follow-up recommended.         IF APPROPRIATE, HAS PATIENT RECEIVED PNEUMOVAX Yes  IF APPROPRIATE, HAS PATIENT RECEIVED  INFLUENZA No    CODE Status: Full code    ASSESSMENT/PLAN:  Active Hospital Problems    Diagnosis   . Primary Problem: Small bowel obstruction   . Upper GI bleed   . Essential tremor   . Hypertension   . History of malignant neoplasm of large intestine   . Ulcerative colitis (CMS [place])   . Hypothyroid     In summary this is [name] 71 year old male with history of abdominal surgery and ileostomy presenting with findings consistent with partial small-bowel obstruction and heme-positive emesis.    Partial small-bowel obstruction   General surgery consulted, spoke with  them, no change in current management and will see patient   IV fluids at 110 cc/hour normal saline, monitor electrolytes   NG tube to intermittent low suction   Strict NPO   Lactic acid 1.3 on admission   Zofran for nausea p.r.n.   Serial abdominal exams   Await General surgery recommendations for repeat imaging    Upper GI bleed   Protonix drip   No bright red blood   Await general surgery recommendations   Holding DVT prophylaxis anticoagulation at this time   Serial H&H    Microscopic asymptomatic hematuria   Found on urinalysis   No symptoms   Undetermined significance   Monitor, consider follow-up    Hypertension   Hold home Norvasc 5 mg once [name] day due to NPO status   Hydralazine 10 mg IV p.r.n. with parameters ordered   Telemetry ordered per protocol for IV antihypertensives    Essential tremor   Patient states that at home he takes p.r.n. Propranolol, none for approximately one week   Monitor    Anxiety   Patient states he intermittently uses  Valium at home   No anxiety currently, patient calm without distress   Last Valium was approximately 1 week per patient report   Monitor closely, consider ativan IV PRN if needed    Hypothyroidism   Patient takes Armour Thyroid 60 mg oral tablet once [name] day in the morning   Will hold for now given strict NPO status   Reassess if unable to tolerate p.o. in next few days    History of colon cancer/history of ulcerative colitis   Status post colectomy with Dr. Rowe Clack    Code status: Full code  Activity: Out of bed with assistance  IV fluids: Normal saline 110 cc/hour  Vitals: Q.4 hours  Consults: General surgery  Nursing: NG tube to intermittent low suction  Diet: Strict NPO  DVT Risk Factors:  Age greater than 40 and Immobility  DVT/PE Prophylaxis: SCDs/ Venodynes/Impulse boots    Disposition: Await General surgery recommendations, serial abdominal exams, serial H&H, continue NG tube        - Will discuss and review patient care with attending.      Margretta Sidle, D.OJoanne Chars Family Medicine Resident      [place]   [address]  [place] [id number]  Dept: [phone]  Dept Fax: [phone]    02/16/2017  ________________________________________________________________________    I saw and evaluated the patient.   I have discussed the patient's case and management with the family medicine team.   I have reviewed the resident's history and physical exam and agree with the findings and plan of care as documented.  Any exceptions/additions are edited/noted.    Wendall Stade, DO 02/16/2017, 10:02  Faculty Physician  [facility]  Family Medicine Residency

## 2017-02-16 NOTE — Care Plan (Signed)
Problem: Skin Injury Risk (Adult,Obstetrics,Pediatric)  Goal: Skin Health and Integrity  Patient will demonstrate the desired outcomes by discharge/transition of care.   Outcome: Ongoing (see interventions/notes)

## 2017-02-16 NOTE — Care Plan (Signed)
Patient admitted to the floor with a small bowel obstruction. Patient is alert and oriented no s/s of distress at this time. Call bell is within reach and patient states hes having no pain. NG placed in Right nare @65 . This patient currently meets requirements for low to mid level nursing care. The patient will continue to be monitored and re-evaluated for any changes in condition.

## 2017-02-16 NOTE — Consults (Signed)
[facility] General Surgery  Consult Note    [name]       71 y.o.       Date of service: 02/16/2017  Date of Admission:  02/16/2017    Hospital Day:  LOS: 0 days     HISTORY OF PRESENT ILLNESS:  [name] is a 71 y.o. male who was admitted to [facility] family Medicine service with 1 day history of nausea vomiting and p.o. intolerance similar to previous small-bowel obstructive episodes.   Patient has a history of ulcerative colitis and colon malignancy for which he underwent initial right colectomy followed by the procto colectomy with end ileostomy with Dr. Rowe Clack.   He has had prior issues with partial small-bowel obstructions in the past managed conservatively.   He underwent abdominal x-ray in the emergency department consistent with air-fluid levels suggestive of small bowel obstruction. 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   He states he has had decreased output from his ostomy in the past day.   NG tube placed in the emergency room department reportedly returned 500 cc.     PAST MEDICAL HISTORY:  Past Medical History:   Diagnosis Date   . Colon cancer (CMS HCC)    . H/O colon cancer, stage I    . 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 Hypothyroid    . Osteoporosis    . 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 Ulcerative colitis (CMS [place]) 03/05/2013         PAST SURGICAL HISTORY:  Past Surgical History:   Procedure Laterality Date   . COLONOSCOPY     . HX OTHER  2015    Proctolectomy    . HX SMALL BOWEL RESECTION  2010   . ILEOSTOMY           FAMILY HISTORY:  Family Medical History     None            SOCIAL HISTORY:  Social History     Social History   . Marital status: Married     Spouse name: N/A   . Number of children: N/A   . Years of education: N/A     Occupational History   .  [employer]     Social History Main Topics   . Smoking status: Never Smoker   . Smokeless tobacco: Never Used   . Alcohol use No   . Drug use: No    . Sexual activity: Yes     Partners: Female     Other Topics Concern   . Not on file     Social History Narrative       ALLERGIES:  No Known Allergies  PROBLEM LIST:  Patient Active Problem List   Diagnosis   . Hypothyroid   . Stenosis of rectum   . History of malignant neoplasm of large intestine   . Ulcerative colitis (CMS [place])   . Hypertension   . Small bowel obstruction   . Upper GI bleed   . Essential tremor   . Asymptomatic microscopic hematuria       REVIEW OF SYSTEMS:   Constitutional:  denies unexplained weight loss, night sweats, fatigue/malaise/lethargy, sleeping Eyes visual changes, headache, eye pain, or  double vision   ENT: Denies runny nose, frequent nose bleeds, sinus pain, stuffy ears, ear pain, ringing in ears (tinnitus), gingival bleeding, toothache, sore throat, pain with  swallowing  Cardiovascular: Denies chest pain, shortness of breath, or exercise intolerance  Respiratory: Denies cough, sputum, wheeze, haemoptysis, or shortness of breath   Gastrointestinal: Denies  unintentional weight loss, difficulty swallowing, bloating, cramping, food avoidance,diarrhea/constipation, or inability to pass gas; positive for abdominal pain, nausea, vomiting, and anorexia  Genitourinary :  Denies urinary incontinence, dysuria, hematuria, nocturia, or polyuria  Musculoskeletal: Denies pain, stiffness , joint swelling   Integumentary: Denies rashes, lesions, wounds, or excessive dryness of the skin   Breast: Denies pain, soreness, lumps, or discharge.   Neurological: Denies any changes in sight, smell, hearing and taste, no evidence of limb weakness.   Psychiatric: Denies depression, change in sleep patterns or anxiety.    PHYSICAL EXAMINATION:  Filed Vitals:    02/16/17 0515 02/16/17 0600 02/16/17 0645 02/16/17 0800   BP: (!) 145/100 (!) 153/97 (!) 150/87 (!) 150/88   Pulse: 82  88 81   Resp: 16  18 18    Temp:    36.7 C (98.1 F)   SpO2: 94%  96% 94%     No intake or output data in the 24 hours ending  02/16/17 1311  I have reviewed the vitals.  General: Pleasant, awake, alert, no acute distress.   HEENT: No scleral icterus or conjunctival injection. Bilateral TM pearly grey without erythema or bulging. No posterior oropharynx erythema or exudate. No oral ulcer or thrush.   Lymph: No anterior or posterior head or neck lymphadenopathy.   CV: RRR without murmurs.   Respiratory: Lungs CTA bilateral, anterior and posterior lung fields without wheezing or crackles.   Abdomen: No palpable hepatosplenomegaly.   Some distention, no peritonitis, ostomy in right lower quadrant with minimal output in the bag.  No palpable hernia at his ostomy site  Extremities: Pink, intact  Neurological: CN II-XII grossly intact. AA&O x 3 without deficits. Speech clear. Upper and lower extremity strength and sensation symmetrical and intact.   Skin: Pink      Labs:     Results for orders placed or performed during the hospital encounter of 02/16/17 (from the past 24 hour(s))   BASIC METABOLIC PANEL   Result Value Ref Range    SODIUM 137 135 - 145 mmol/L    POTASSIUM 4.0 3.5 - 5.0 mmol/L    CHLORIDE 99 98 - 111 mmol/L    CO2 TOTAL 29 21 - 35 mmol/L    ANION GAP 9 mmol/L    CALCIUM 10.1 8.2 - 10.2 mg/dL    GLUCOSE 144 (H) 70 - 110 mg/dL    BUN 21 10 - 25 mg/dL    CREATININE 0.80 <=1.30 mg/dL    BUN/CREA RATIO 26     ESTIMATED GFR >60 Avg: 75 mL/min/1.66m^2   CBC/DIFF    Narrative    The following orders were created for panel order CBC/DIFF.  Procedure                               Abnormality         Status                     ---------                               -----------         ------  CBC WITH DIFF[[id number]]                Abnormal            Final result                 Please view results for these tests on the individual orders.   LIPASE   Result Value Ref Range    LIPASE 31 <=82 U/L   HEPATIC FUNCTION PANEL   Result Value Ref Range    ALBUMIN 4.5 3.2 - 4.6 g/dL    ALKALINE PHOSPHATASE 52 20 - 130 U/L    ALT  (SGPT) 15 <=52 U/L    AST (SGOT) 15 <=35 U/L    BILIRUBIN TOTAL 1.2 0.3 - 1.2 mg/dL    BILIRUBIN DIRECT 0.2 <=0.3 mg/dL    PROTEIN TOTAL 7.7 6.0 - 8.3 g/dL   LACTIC ACID   Result Value Ref Range    LACTIC ACID 1.3 <2.0 mmol/L   URINALYSIS WITH REFLEX MICROSCOPIC AND CULTURE IF POSITIVE    Narrative    The following orders were created for panel order URINALYSIS WITH REFLEX MICROSCOPIC AND CULTURE IF POSITIVE.  Procedure                               Abnormality         Status                     ---------                               -----------         ------                     URINALYSIS, MACRO/MICRO[[id number]]      Abnormal            Final result                 Please view results for these tests on the individual orders.   CBC WITH DIFF   Result Value Ref Range    WBC 9.4 (H) 3.3 - 9.3 x10^3/uL    RBC 4.97 4.40 - 5.68 x10^6/uL    HGB 16.2 13.4 - 17.3 g/dL    HCT 47.5 38.9 - 50.5 %    MCV 95.7 (H) 82.4 - 95.0 fL    MCH 32.6 27.9 - 33.1 pg    MCHC 34.1 32.8 - 36.0 g/dL    RDW 13.8 10.9 - 15.1 %    PLATELETS 236 140 - 440 x10^3/uL    MPV 8.0 6.0 - 10.2 fL    NEUTROPHIL % 81 %    LYMPHOCYTE % 8 %    MONOCYTE % 11 %    EOSINOPHIL % 0 %    BASOPHIL % 0 %    NEUTROPHIL # 7.60 (H) 1.60 - 5.50 x10^3/uL    LYMPHOCYTE # 0.70 (L) 0.80 - 3.20 x10^3/uL    MONOCYTE # 1.10 (H) 0.20 - 0.80 x10^3/uL    EOSINOPHIL # 0.00 0.00 - 0.50 x10^3/uL    BASOPHIL # 0.00 0.00 - 0.20 x10^3/uL   URINALYSIS, MACRO/MICRO   Result Value Ref Range    COLOR Yellow Yellow, Straw, Colorless    APPEARANCE Clear Clear, Cloudy    SPECIFIC GRAVITY 1.022 1.010 - 1.025    PH 6.0  5.0 - 7.0    LEUKOCYTES Negative Negative WBCs/uL    NITRITE Negative Negative    PROTEIN Small (A) Negative, Trace mg/dL    GLUCOSE Negative Negative mg/dL    KETONES Trace (A) Negative mg/dL    UROBILINOGEN 1.0 normal, 0.2 , 1.0 mg/dL    BILIRUBIN Negative Negative mg/dL    BLOOD Negative Negative mg/dL    RBCS 3-10 (A) None /hpf    WBCS 0-5 (A) None /hpf    BACTERIA None  None /hpf    SQUAMOUS EPITHELIAL None None, Rare, Occasional /hpf    HYALINE CASTS Occasional None, Rare, Few, Occasional, Many /lpf   URINE DRUG SCREEN   Result Value Ref Range    COCAINE METABOLITES URINE Negative Negative    PCP URINE Negative Negative    CANNABINOIDS URINE Negative Negative    AMPHETAMINES URINE Negative Negative    BARBITURATES URINE Negative Negative    BENZODIAZEPINES URINE Positive - Not Confirmed (A) Negative    OPIATES URINE Negative Negative    Narrative    Unconfirmed screening results must not be used for non-medical purposes   (e.g. employment testing, legal testing, etc.)    Cut-off values are listed below:  Amphetamines - < 1000 ng/mL  Barbiturates - < 200 ng/mL  Benzodiazepines - < 200 ng/mL  Cocaine - < 300 ng/mL  Opiate - < 300 ng/mL  Oxycodone - < 100 ng/mL  PCP - < 25 ng/mL  THC5 - < 50 ng/mL  Buprenorphine - < 10 ng/mL  Methadone - < 300 ng/mL       I have reviewed all labs.      Current Facility-Administered Medications:   .  hydrALAZINE (APRESOLINE) injection 10 mg, 10 mg, Intravenous, Q6H PRN, Lowell Guitar, DO  .  NS flush syringe, 3 mL, Intracatheter, Q8HRS, Amada Jupiter, MD, 3 mL at 02/16/17 0600  .  NS flush syringe, 3 mL, Intracatheter, Q1H PRN, Amada Jupiter, MD  .  NS flush syringe, 3 mL, Intracatheter, Q8HRS, Lowell Guitar, DO  .  NS flush syringe, 3 mL, Intracatheter, Q1H PRN, Lowell Guitar, DO  .  NS premix infusion, , Intravenous, Continuous, Lowell Guitar, DO, Last Rate: 110 mL/hr at 02/16/17 1022  .  ondansetron (ZOFRAN) 2 mg/mL injection, 4 mg, Intravenous, Q6H PRN, Lowell Guitar, DO  .  [COMPLETED] pantoprazole (PROTONIX) injection, 80 mg, Intravenous, Once, 80 mg at 02/16/17 0706 **AND** pantoprazole (PROTONIX) 80 mg in NS 100 mL (tot vol) infusion, , Intravenous, Continuous, Amada Jupiter, MD, Last Rate: 10 mL/hr at 02/16/17 0700    Micro: No results found for any visits on 02/16/17 (from the past  96 hour(s)).    Radiology:    Results for orders placed or performed during the hospital encounter of 02/16/17 (from the past 24 hour(s))   XR ABD FLAT AND UPRIGHT SERIES (W PA CHEST)     Status: None    Narrative    [name]    PROCEDURE DESCRIPTION: XR ABD FLAT AND UPRIGHT SERIES (W PA CHEST)    PROCEDURE PERFORMED DATE AND TIME: 02/16/2017 5:25 AM    CLINICAL INDICATION: abd pain    TECHNIQUE: 3 views / 3 images submitted.    COMPARISON: 04/08/2016      FINDINGS: The heart size and pulmonary vasculature appear stable. Chronic  changes are present within the lungs. No focal consolidation or pulmonary  edema.    Right lower quadrant ostomy. Air-fluid levels are present  within distended  small bowel loops. Small bowel distention measures up to 4.8 cm within the  central abdomen. Findings raise concern for at least partial small bowel  obstruction. Close follow-up recommended.      Impression    1. Chronic changes within the lungs without focal consolidation or  pulmonary edema.  2. Postoperative changes within the abdomen. Air-fluid levels within  distended small bowel loops, raising concern for partial small bowel  obstruction. Close follow-up recommended.         ASSESSMENT & RECOMMENDATIONS:   [name] is a 71 y.o.male with history of ulcerative colitis status post proctocolectomy with end ileostomy now with concern for small bowel obstruction.   - continue NG tube to low wall suction  - NPO with IVF   - p.r.n. electrolyte replacement   - suggest obtaining CT abdomen pelvis with p.o. Contrast since he has had an interval of decompression via his NG tube. May give contrast through his NG tube and clamp for this CT scan  - will follow    Walker Shadow, MD  [facility]

## 2017-02-16 NOTE — ED Nurses Note (Signed)
Pt had sudden episode of vomiting coffee ground emesis.  Gastro-cult positive for blood.  Pt states he feels better after vomiting.  No complaints of nausea.  Pt soiled clothing taken off and bagged up.  gown put on pt, warm blankets.

## 2017-02-16 NOTE — Care Plan (Signed)
Problem: Bowel Obstruction (Adult)  Prevent and manage potential problems including:  1. diarrhea  2. electrolyte/acid-base imbalance  3. fluid volume deficit/hypovolemia  4. nausea and vomiting  5. pain  6. peritonitis  7. situational response  8. undernutrition   Goal: Signs and Symptoms of Listed Potential Problems Will be Absent, Minimized or Managed (Bowel Obstruction)  Signs and symptoms of listed potential problems will be absent, minimized or managed by discharge/transition of care (reference Bowel Obstruction (Adult) CPG).   Outcome: Completed Date Met: 02/16/17

## 2017-02-16 NOTE — ED Provider Notes (Signed)
Department of Emergency [place]    HPI - 02/16/2017    Attending: Dr. Amada Jupiter  Chief Complaint:  Chief Complaint   Patient presents with   . Ostomy     sts ileostomy "is partially not working."     History of Present Illness:  [name] is a 71 y.o. male with complaint of nausea and vomiting yesterday.  Patient was not able to drink much fluids last 24 hr because of nausea .  Patient comes here this morning asking for IV fluids because he feels dehydrated.  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  He had rectal and colon resection few years ago.  He has ileostomy bag in right lower quadrant of his abdomen.  No fever.  No abdominal pain. Patient noticed a decreased ostomy output today.                                     History Limitations:  None    Review of Systems:  Constitutional: No fever, chills or weakness   Skin: No rashes or diaphoresis   HENT: No congestion or rhinorrhea   Eyes: No vision changes, discharge   Cardio: No chest pain, palpitations or leg swelling    Respiratory: No cough, wheezing or SOB   GI:  Nausea and vomiting  GU:  No dysuria, hematuria, polyuria   MSK: No joint or back pain   Neuro: No loss of sensation, headache, focal deficits or LOC     All other systems reviewed and are negative    Allergies:  No Known Allergies  Past Medical History:  Past Medical History:   Diagnosis Date   . Colon cancer (CMS HCC)    . H/O colon cancer, stage I    . 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 Hypothyroid    . Osteoporosis    . 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 Ulcerative colitis (CMS [place]) 03/05/2013         Past Surgical History:  Past Surgical History:   Procedure Laterality Date   . COLONOSCOPY     . HX OTHER  2015    Proctolectomy    . HX SMALL BOWEL RESECTION  2010   . ILEOSTOMY           Social History:  Social History   Substance Use Topics   . Smoking status: Never Smoker   . Smokeless tobacco: Never  Used   . Alcohol use No     Family History:  Family Medical History     None              Physical Exam:  All nurse's notes reviewed.  Filed Vitals:    02/16/17 0427 02/16/17 0445 02/16/17 0500   BP: (!) 199/106 (!) 159/93    Pulse: (!) 105 79    Resp: 16 16    Temp: 36.2 C (97.2 F)     SpO2: 99% 95% 95%        Constitutional: WD/WN male in no acute distress.  Alert and Oriented x 3.     HEENT:      Head: NC/AT    Ears: NL external ears.   Eyes:Conjunctiva pink, sclera white, no discharge.   Nose: No drainage or bleeding.        Mouth/Throat: MM moist.  Neck: Trachea midline. No deformity.     Cardiovascular: Heart RRR, no gallops.    Pulmonary/Chest: Breath sounds equal bilaterally, good air movement. No respiratory distress. No wheezes, rales. No chest tenderness.    Abdominal: Normal BS. Abdomen soft, ND, ileostomy in place with small amount of stool in the bag.  Midline abdominal hernia present, nontender.    Back: NL inspection, no tenderness.    Musculoskeletal: No obvious deformity or swelling. 2+ peripheral pulses.    Skin: Warm and dry. No rash, erythema, pallor or cyanosis.    Neurological: A+O x 3. Grossly intact. Moves all extremities spontaneously. Face symmetrical, speech NL.    Lymphatic: Cervical nodes normal. Supraclavicular nodes normal.      Orders, Abnormal Labs and Imaging Results:  Results up to the Time the Disposition was Entered   BASIC METABOLIC PANEL   CBC/DIFF    Narrative:     The following orders were created for panel order CBC/DIFF.  Procedure                               Abnormality         Status                     ---------                               -----------         ------                     CBC WITH DIFF[[id number]]                                                                 Please view results for these tests on the individual orders.   LIPASE   HEPATIC FUNCTION PANEL   LACTIC ACID   URINALYSIS WITH REFLEX MICROSCOPIC AND CULTURE IF POSITIVE    Narrative:     The  following orders were created for panel order URINALYSIS WITH REFLEX MICROSCOPIC AND CULTURE IF POSITIVE.  Procedure                               Abnormality         Status                     ---------                               -----------         ------                     URINALYSIS, MACRO/MICRO[[id number]]                                                       Please view results for these tests on the  individual orders.   INSERT & MAINTAIN PERIPHERAL IV ACCESS   CBC WITH DIFF   URINALYSIS, MACRO/MICRO   NS flush syringe (not administered)   NS flush syringe (not administered)   NS bolus infusion 1,000 mL (not administered)       MDM:      The x-ray shows partial bowel obstruction.  Patient was treated with liter of normal saline.  Patient had an episode of large emesis while in the ED.  It was brown and guaiac positive. Patient was started on Protonix.  Nasogastric tube was placed.  Patient has an upper GI bleed.  The Family Medicine resident on call was contacted and agreed to admit patient.  Patient agrees with the plan.      ED Course     Consults:  General surgery    Impression:  Partial small bowel obstruction, upper GI bleed    Disposition:      Admission: Patient will be admitted to the [facility] Medicine service for further evaluation and management of partial bowel obstruction and upper GI bleed.        Amada Jupiter, MD  02/16/2017, 05:03    Parts of this patients chart were completed in a retrospective fashion due to simultaneous direct patient care activities in the Emergency Department.   This note was partially generated using MModal Fluency Direct system, and there may be some incorrect words, spellings, and punctuation that were not noted in checking the note before saving.

## 2017-02-16 NOTE — ED Nurses Note (Signed)
Called report to [facility] nurse

## 2017-02-16 NOTE — ED Nurses Note (Signed)
Dr Calvert Cantor notified of emesis.  He went in to talk to pt.

## 2017-02-17 ENCOUNTER — Inpatient Hospital Stay (HOSPITAL_COMMUNITY): Payer: BLUE CROSS/BLUE SHIELD

## 2017-02-17 DIAGNOSIS — F329 Major depressive disorder, single episode, unspecified: Secondary | ICD-10-CM

## 2017-02-17 DIAGNOSIS — F419 Anxiety disorder, unspecified: Secondary | ICD-10-CM

## 2017-02-17 DIAGNOSIS — K5190 Ulcerative colitis, unspecified, without complications: Secondary | ICD-10-CM

## 2017-02-17 DIAGNOSIS — K922 Gastrointestinal hemorrhage, unspecified: Secondary | ICD-10-CM

## 2017-02-17 DIAGNOSIS — G250 Essential tremor: Secondary | ICD-10-CM

## 2017-02-17 DIAGNOSIS — E039 Hypothyroidism, unspecified: Secondary | ICD-10-CM

## 2017-02-17 DIAGNOSIS — Z85038 Personal history of other malignant neoplasm of large intestine: Secondary | ICD-10-CM

## 2017-02-17 DIAGNOSIS — K56600 Partial intestinal obstruction, unspecified as to cause: Secondary | ICD-10-CM

## 2017-02-17 DIAGNOSIS — Z932 Ileostomy status: Secondary | ICD-10-CM

## 2017-02-17 DIAGNOSIS — R3121 Asymptomatic microscopic hematuria: Secondary | ICD-10-CM

## 2017-02-17 DIAGNOSIS — I10 Essential (primary) hypertension: Secondary | ICD-10-CM

## 2017-02-17 DIAGNOSIS — K56609 Unspecified intestinal obstruction, unspecified as to partial versus complete obstruction: Secondary | ICD-10-CM

## 2017-02-17 LAB — CBC WITH DIFF
BASOPHIL #: 0 x10ˆ3/uL (ref 0.00–0.20)
BASOPHIL %: 0 %
EOSINOPHIL #: 0 x10ˆ3/uL (ref 0.00–0.50)
EOSINOPHIL %: 0 %
HCT: 42.3 % (ref 38.9–50.5)
HGB: 14.4 g/dL (ref 13.4–17.3)
LYMPHOCYTE #: 0.7 x10ˆ3/uL — ABNORMAL LOW (ref 0.80–3.20)
LYMPHOCYTE %: 10 %
MCH: 32.8 pg (ref 27.9–33.1)
MCHC: 34.1 g/dL (ref 32.8–36.0)
MCV: 96.2 fL — ABNORMAL HIGH (ref 82.4–95.0)
MONOCYTE #: 1.1 x10ˆ3/uL — ABNORMAL HIGH (ref 0.20–0.80)
MONOCYTE %: 15 %
MPV: 8.4 fL (ref 6.0–10.2)
NEUTROPHIL #: 5.6 x10ˆ3/uL — ABNORMAL HIGH (ref 1.60–5.50)
NEUTROPHIL %: 76 %
PLATELETS: 201 x10ˆ3/uL (ref 140–440)
RBC: 4.4 x10ˆ6/uL (ref 4.40–5.68)
RDW: 13.7 % (ref 10.9–15.1)
WBC: 7.4 x10ˆ3/uL (ref 3.3–9.3)

## 2017-02-17 LAB — BASIC METABOLIC PANEL
ANION GAP: 8 mmol/L
BUN/CREA RATIO: 28
BUN: 17 mg/dL (ref 10–25)
CALCIUM: 8.7 mg/dL (ref 8.2–10.2)
CHLORIDE: 103 mmol/L (ref 98–111)
CO2 TOTAL: 25 mmol/L (ref 21–35)
CREATININE: 0.61 mg/dL (ref <=1.30)
ESTIMATED GFR: >60 mL/min/1.73mˆ2
GLUCOSE: 118 mg/dL — ABNORMAL HIGH (ref 70–110)
POTASSIUM: 3.8 mmol/L (ref 3.5–5.0)
SODIUM: 136 mmol/L (ref 135–145)
SODIUM: 136 mmol/L (ref 135–145)

## 2017-02-17 LAB — MAGNESIUM: MAGNESIUM: 1.9 mg/dL (ref 1.8–2.3)

## 2017-02-17 LAB — PHOSPHORUS: PHOSPHORUS: 3.2 mg/dL (ref 2.5–4.5)

## 2017-02-17 LAB — PT/INR
INR: 1.27 — ABNORMAL HIGH (ref 0.90–1.10)
INR: 1.27 — ABNORMAL HIGH (ref 0.90–1.10)

## 2017-02-17 MED ORDER — MORPHINE 2 MG/ML INTRAVENOUS CARTRIDGE
2.00 mg | CARTRIDGE | INTRAVENOUS | Status: AC
Start: 2017-02-17 — End: 2017-02-17
  Administered 2017-02-17: 2 mg via INTRAVENOUS
  Filled 2017-02-17: qty 1

## 2017-02-17 MED ORDER — LEVOTHYROXINE 100 MCG IV SOLUTION - EAST
50.00 ug | Freq: Every morning | INTRAVENOUS | Status: DC
Start: 2017-02-17 — End: 2017-02-17
  Filled 2017-02-17: qty 2.5
  Filled 2017-02-17 (×2): qty 3

## 2017-02-17 MED ORDER — THYROID (PORK) 60 MG TABLET
60.00 mg | ORAL_TABLET | Freq: Every day | ORAL | Status: DC
Start: 2017-02-18 — End: 2017-02-18
  Administered 2017-02-18: 60 mg via ORAL
  Filled 2017-02-17 (×4): qty 1

## 2017-02-17 MED ORDER — AMLODIPINE 5 MG TABLET
5.0000 mg | ORAL_TABLET | Freq: Every day | ORAL | Status: DC
Start: 2017-02-18 — End: 2017-02-18
  Administered 2017-02-18: 5 mg via ORAL
  Filled 2017-02-17 (×2): qty 1

## 2017-02-17 MED ADMIN — lactated Ringers intravenous solution: @ 22:00:00 | NDC 00264775000

## 2017-02-17 MED ADMIN — sodium chloride 0.9 % (flush) injection syringe: @ 14:00:00

## 2017-02-17 NOTE — Consults (Signed)
[facility]  Surgery Progress Note    [name], 71 y.o. male  Date of Birth:  05/27/1946  Date of Admission:  02/16/2017  Date of service: 02/17/2017      Assessment/ Plan:  Active Hospital Problems    Diagnosis    Primary Problem: Small bowel obstruction    Anxiety    Upper GI bleed    Essential tremor    Asymptomatic microscopic hematuria    Hypertension    History of malignant neoplasm of large intestine    Ulcerative colitis (CMS HCC)    Hypothyroid     Resolving bowel obstruction.  -discontinue nasogastric tube  -full liquid diet  -if patient does well advance diet tomorrow and discharge home.  Upper GI bleed?  -no evidence of ongoing upper GI bleed  -I am fine with this continuing Protonix drip if okay with primary service.    Marguerita Beards, MD    Subjective     Feeling much better today.  His emptied ileostomy back 5 times.  No nausea.  Results of abdominal series discussed with patient as well as personally reviewed.  Current Medications:    Current Facility-Administered Medications:  diatrizoate meglumine & sodium (MD-GASTROVIEW/GASTROGRAFIN) oral solution 15 mL Oral See Comment   hydrALAZINE (APRESOLINE) injection 10 mg 10 mg Intravenous Q6H PRN   levothyroxine (SYNTHROID) 20 mcg/mL injection 50 mcg Intravenous QAM   NS flush syringe 3 mL Intracatheter Q8HRS   NS flush syringe 3 mL Intracatheter Q1H PRN   NS flush syringe 3 mL Intracatheter Q8HRS   NS flush syringe 3 mL Intracatheter Q1H PRN   NS premix infusion  Intravenous Continuous   ondansetron (ZOFRAN) 2 mg/mL injection 4 mg Intravenous Q6H PRN   pantoprazole (PROTONIX) 80 mg in NS 100 mL (tot vol) infusion  Intravenous Continuous       Objective     Vital Signs:  Temp (24hrs) Max:37.1 C (50.3 F)      Systolic (88EKC), MKL:491 , Min:149 , PHX:505     Diastolic (69VXY), IAX:65, Min:81, Max:94    Temp  Avg: 36.9 C (98.5 F)  Min: 36.8 C (98.2 F)  Max: 37.1 C (98.8 F)  Pulse  Avg: 77.5  Min: 73  Max: 82  Resp  Avg: 20  Min:  18  Max: 21  SpO2  Avg: 93.8 %  Min: 92 %  Max: 96 %  Pain Score (Numeric, Faces): 4    Today's Physical Exam:  Temperature: 37.1 C (98.8 F)  Heart Rate: 80  BP (Non-Invasive): (!) 156/82  Respiratory Rate: (!) 21  SpO2-1: 92 %  Pain Score (Numeric, Faces): 4  CVS: Regular rate and rhythm  LUNGS: Clear to auscultation  ABD:   Soft, stool within ileostomy bag, nontender    I/O:  I/O last 24 hours:    Intake/Output Summary (Last 24 hours) at 02/17/17 1449  Last data filed at 02/17/17 0945   Gross per 24 hour   Intake              100 ml   Output              450 ml   Net             -350 ml     I/O current shift:  09/24 0800 - 09/24 1559  In: -   Out: 200 [Urine:200]      Labs:  Results for orders placed or performed during the hospital encounter  of 02/16/17 (from the past 24 hour(s))   CBC/DIFF    Narrative    The following orders were created for panel order CBC/DIFF.  Procedure                               Abnormality         Status                     ---------                               -----------         ------                     CBC WITH AGTX[[id number]]                Abnormal            Final result                 Please view results for these tests on the individual orders.   BASIC METABOLIC PANEL, NON-FASTING   Result Value Ref Range    SODIUM 136 135 - 145 mmol/L    POTASSIUM 3.8 3.5 - 5.0 mmol/L    CHLORIDE 103 98 - 111 mmol/L    CO2 TOTAL 25 21 - 35 mmol/L    ANION GAP 8 mmol/L    CALCIUM 8.7 8.2 - 10.2 mg/dL    GLUCOSE 118 (H) 70 - 110 mg/dL    BUN 17 10 - 25 mg/dL    CREATININE 0.61 <=1.30 mg/dL    BUN/CREA RATIO 28     ESTIMATED GFR >60 Avg: 75 mL/min/1.43m2   MAGNESIUM   Result Value Ref Range    MAGNESIUM 1.9 1.8 - 2.3 mg/dL   PHOSPHORUS   Result Value Ref Range    PHOSPHORUS 3.2 2.5 - 4.5 mg/dL   PT/INR   Result Value Ref Range    INR 1.27 (H) 0.90 - 1.10   CBC WITH DIFF   Result Value Ref Range    WBC 7.4 3.3 - 9.3 x103/uL    RBC 4.40 4.40 - 5.68 x106/uL    HGB 14.4 13.4 - 17.3 g/dL     HCT 42.3 38.9 - 50.5 %    MCV 96.2 (H) 82.4 - 95.0 fL    MCH 32.8 27.9 - 33.1 pg    MCHC 34.1 32.8 - 36.0 g/dL    RDW 13.7 10.9 - 15.1 %    PLATELETS 201 140 - 440 x103/uL    MPV 8.4 6.0 - 10.2 fL    NEUTROPHIL % 76 %    LYMPHOCYTE % 10 %    MONOCYTE % 15 %    EOSINOPHIL % 0 %    BASOPHIL % 0 %    NEUTROPHIL # 5.60 (H) 1.60 - 5.50 x103/uL    LYMPHOCYTE # 0.70 (L) 0.80 - 3.20 x103/uL    MONOCYTE # 1.10 (H) 0.20 - 0.80 x103/uL    EOSINOPHIL # 0.00 0.00 - 0.50 x103/uL    BASOPHIL # 0.00 0.00 - 0.20 x103/uL         Radiology Tests :  Results for orders placed or performed during the hospital encounter of 02/16/17 (from the past 24 hour(s))   CT ABDOMEN PELVIS WO IV CONTRAST  Status: None    Narrative    [name]    PROCEDURE DESCRIPTION: CT ABDOMEN PELVIS WO IV CONTRAST    PROCEDURE PERFORMED DATE AND TIME: 02/17/2017 1:42 AM    CT DOSE INFORMATION:  DLP (mGycm):  390    CLINICAL INDICATION: Small bowel obstruction,    COMPARISON: X-ray abdomen flat and upright series 02/16/2017      FINDINGS:     Lung bases: Coronary artery calcifications. Bibasilar  atelectasis/consolidation with small pleural effusions, left greater than  right.    Liver: Fatty infiltration changes within the liver.    Stomach: An enteric tube is in place within the stomach.    Pancreas: No acute process.    Adrenal glands: No acute process.    Kidneys: Mild chronic changes within the kidneys. No hydronephrosis.  Bilateral renal cortical low-density lesions, favor [name] renal cyst.    Spleen: Normal size    Bladder: No abnormal bladder wall thickening.    Bowel: Postoperative changes status post colectomy with right lower  quadrant ostomy. Multiple fluid-filled, distended bowel loops are present,  extending to the level of the ostomy. Fluid is present adjacent to the  ostomy site. Small bowel loops entering the ostomy site are minimally  kinked. Partial small bowel obstruction is possible.    Gallbladder and biliary system: Gallstones are  suspected. No abnormal  pericholecystic inflammatory changes.    Mesentery and lymph nodes: No free air or drainable fluid collection.    Bones: Grade 1 anterolisthesis L4-L5, centered to facet arthropathy.  Multilevel degenerative changes within the spine.      Impression    1. Postoperative changes status post colectomy with right lower quadrant  ostomy. Multiple dilated, fluid-filled small bowel loops are present,  extending to the level of the ostomy site. Findings could represent an  enteritis, ileus or partial bowel obstruction. Plain film follow-up  recommended.  2. Bibasilar atelectasis/consolidation with small pleural effusions.  3. More details and findings, as above.        The CT exam was performed using one or more the following [name] dose reduction  techniques: Automated exposure control, adjustment of the mA and/or kV  according to the patient's size, or use of iterative reconstruction  technique.     XR ABD FLAT AND UPRIGHT SERIES (W PA CHEST)     Status: None    Narrative    [name]    PROCEDURE DESCRIPTION: XR ABD FLAT AND UPRIGHT SERIES (W PA CHEST)    PROCEDURE PERFORMED DATE AND TIME: 02/17/2017 2:27 PM    CLINICAL INDICATION: partial small bowel obstruction follow up    TECHNIQUE: 3 views / 3 images submitted.    COMPARISON: 02/16/2017      FINDINGS:         Supine and upright projections of the abdomen and [name] frontal view of the  chest were obtained.    Cardiac and mediastinal silhouettes are within normal limits. No infiltrate  or edema is present. Atelectatic changes are now seen at the left base.  Enteric tube noted with the tip in the region of the stomach. 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 Distention of central small bowel loops is improved.  No free air is seen in the abdomen.       Impression    No evidence for acute process in the chest. Improvement in  distended proximal small bowel with placement of enteric tube.  Marguerita Beards, MD

## 2017-02-17 NOTE — Care Plan (Signed)
Problem: Discharge Needs Assessment  Goal: Discharge Needs Assessment  IP admit / small bowel obx. [name] will likely dc to home w no needs once resolved. Will continue to monitor for developing needs.

## 2017-02-17 NOTE — Care Plan (Signed)
Problem: Patient Care Overview (Adult,OB)  Goal: Plan of Care Review(Adult,OB)  The patient and/or their representative will communicate an understanding of their plan of care   Outcome: Ongoing (see interventions/notes)      Problem: Fall Risk (Adult)  Goal: Absence of Falls  Patient will demonstrate the desired outcomes by discharge/transition of care.   Outcome: Ongoing (see interventions/notes)      Problem: Skin Injury Risk (Adult,Obstetrics,Pediatric)  Goal: Skin Health and Integrity  Patient will demonstrate the desired outcomes by discharge/transition of care.   Outcome: Ongoing (see interventions/notes)      Comments: Patient is alert and oriented and ambulates with stand by assist.  Patient's NG tube was removed today after his ileostomy has been putting out large amounts of stool.  It has been emptied several times today with liquid stool.  Patient has tolerated a full liquid diet well.  This patient currently meets requirements for low to mid level nursing care. The patient will continue to be monitored and re-evaluated for any changes in condition.    Baker Janus, RN

## 2017-02-17 NOTE — Care Plan (Signed)
Problem: Patient Care Overview (Adult,OB)  Goal: Plan of Care Review(Adult,OB)  The patient and/or their representative will communicate an understanding of their plan of care   Gave patient contrast for CT and made him nauseous a few times. Gave patient Zofran to help with nausea. Patient complained of gas pains around 0200 this morning, paged family med and they put in an order for morphine one time. Once given, patient has been resting comfortably in bed with no other complaints. NG tube still in place and putting out. Will continue to monitor. This patient currently meets requirements for low to mid level nursing care. The patient will continue to be monitored and re-evaluated for any changes in condition.   Jani Gravel, RN

## 2017-02-17 NOTE — Care Plan (Signed)
Problem: Patient Care Overview (Adult,OB)  Goal: Interdisciplinary Rounds/Family Conf  Outcome: Ongoing (see interventions/notes)      Problem: Fall Risk (Adult)  Goal: Identify Related Risk Factors and Signs and Symptoms  Related risk factors and signs and symptoms are identified upon initiation of Human Response Clinical Practice Guideline (CPG).   Outcome: Completed Date Met: 02/17/17    Goal: Absence of Falls  Patient will demonstrate the desired outcomes by discharge/transition of care.   Outcome: Ongoing (see interventions/notes)      Problem: Skin Injury Risk (Adult,Obstetrics,Pediatric)  Goal: Identify Related Risk Factors and Signs and Symptoms  Related risk factors and signs and symptoms are identified upon initiation of Human Response Clinical Practice Guideline (CPG).   Outcome: Completed Date Met: 02/17/17    Goal: Skin Health and Integrity  Patient will demonstrate the desired outcomes by discharge/transition of care.   Outcome: Ongoing (see interventions/notes)

## 2017-02-17 NOTE — Progress Notes (Signed)
[place] [address]       Phone: [phone]   Fax: [place] NOTE     Patient Name: [name]  Date of service: 02/17/2017  Date of Admission:  02/16/2017  Hospital Day:  LOS: 1 day     SUMMARY     [name] is a 71 y.o. old male, with PMH of ulcerative colitis, HTN, Anxiety, Hypothyroidim, Partial Small Bowel Obstruction and Colon Cancer s/p colectomy with ileostomy placement approximately 3-4 years ago by Dr. Rowe Clack. Approximately 2 days ago patient began to experience sudden onset of nausea and vomiting. He was unable to tolerate p.o. He denied any significant abdominal pain at that time. Occurred spontaneously while he was doing his daily activities. Denied any fever or chills.  Denied any dysuria hematuria. Noted that his emesis was slightly dark. Notice decreased ostomy output. Came to the emergency room for evaluation. Of note, patient had a similar presentation in February 2018. He has a  history of circumferential ostomy hernia.    In emergency room, patient was noted on KUB to have a partial small-bowel obstruction. Urinalysis showed trace ketones and trace protein. Lactic acid was unremarkable. Mild leukocytosis was noted. LFTs were within normal limits. Kidney function was stable. Patient's emesis was tested for heme and found to be positive. Patient had an NG-tube placed on intermittent suction and a Protonix drip started. Vitals were stable knee emergency room other than slight hypertension. Patient was given a 1 L normal saline bolus.    Patient admitted for Partial Small Bowel Obstruction. General Surgery consulted and recommendations suggest continuation of conservative management. NPO with IVF at 110 cc/hr, continue NG tube with low suction. Replace electrolytes as needed. CT of abdomen/pelvis on 09/23 revealed postoperative changes with fluid filled small bowel loops suggesting partial small bowel obstruction.  Repeat abdominal xray ordered today.       SUBJECTIVE     Patient seen and examined this AM. Patient did well with no overnight events reported. Patient reports minimal abdominal pain this AM. He states he has noticed increased ileostomy output today. Patient denies CP/SOB/N/V/D/C, or any other complaints at this time.    Diet - Strict NPO   Activity - OOB with Assistance   Code Status - Full Code    Review of systems:   All systems negative except as noted above.    OBJECTIVE     Vital Signs:  BP (!) 156/82   Pulse 80   Temp 37.1 C (98.8 F)   Resp (!) 21   Ht 1.838 m (6' 0.36")   Wt 72.6 kg (160 lb)   SpO2 92%   BMI 21.48 kg/m2    Physical Exam:  GENERAL:  appears chronically ill, appears stated age, no distress and vital signs reviewed  LUNGS: Clear to auscultation bilaterally without wheezing or rales appreciated in posterior lung fields   CARDIOVASCULAR:  regular rate and rhythm  no murmurs  ABDOMEN:  Abdomen soft, minimal tenderness to palpation, mild distention without guarding, bowel sounds present in all 4 quadrants   NEUROLOGICAL: negative findings: speech normal, mental status intact, cranial nerves 2-12 intact  EXTREMITIES:  extremities normal, atraumatic, no cyanosis or edema    I/O:    Intake/Output Summary (Last 24 hours) at 02/17/17 1040  Last data filed at 02/17/17 0945   Gross per 24 hour  Intake              100 ml   Output              450 ml   Net             -350 ml       Blood Sugars:   No results for input(s): GLUIP in the last 48 hours.     LABS & IMAGING     CBC Results    Recent Labs      02/16/17   0535  02/17/17   0428   WBC  9.4*  7.4   HGB  16.2  14.4   HCT  47.5  42.3   PLTCNT  236  201      BMP Results    Recent Labs      02/16/17   0535  02/17/17   0428   SODIUM  137  136   POTASSIUM  4.0  3.8   CHLORIDE  99  103   CO2  29  25   BUN  21  17   CREATININE  0.80  0.61   GFR  >60  >60   ANIONGAP  9  8      Recent Labs      02/16/17   0535  02/17/17   0428   CALCIUM  10.1  8.7      ALBUMIN  4.5   --    MAGNESIUM   --   1.9      Cardiac Results   No results for input(s): UHCEASTTROPI, CKMB, MBINDEX, BNP in the last 72 hours.   Liver/Pancrease Enzyme Results   Recent Labs      02/16/17   0535   TOTALPROTEIN  7.7   ALBUMIN  4.5   AST  15   ALT  15   ALKPHOS  52   LIPASE  31      Coagulation Studies    Recent Labs      02/17/17   0428   INR  1.27*      Lipid Panel Results    Lab Results   Component Value Date    CHOLESTEROL 178 07/13/2016    HDLCHOL 50 07/13/2016    LDLCHOLDIR 105 (H) 03/05/2016    TRIG 91 07/13/2016      Imaging Studies     Results for orders placed or performed during the hospital encounter of 02/16/17 (from the past 24 hour(s))   CT ABDOMEN PELVIS WO IV CONTRAST     Status: None    Narrative    Jene A Miao    PROCEDURE DESCRIPTION: CT ABDOMEN PELVIS WO IV CONTRAST    PROCEDURE PERFORMED DATE AND TIME: 02/17/2017 1:42 AM    CT DOSE INFORMATION:  DLP (mGycm):  390    CLINICAL INDICATION: Small bowel obstruction,    COMPARISON: X-ray abdomen flat and upright series 02/16/2017      FINDINGS:     Lung bases: Coronary artery calcifications. Bibasilar  atelectasis/consolidation with small pleural effusions, left greater than  right.    Liver: Fatty infiltration changes within the liver.    Stomach: An enteric tube is in place within the stomach.    Pancreas: No acute process.    Adrenal glands: No acute process.    Kidneys: Mild chronic changes within the kidneys. No hydronephrosis.  Bilateral renal cortical low-density lesions, favor a renal cyst.    Spleen: Normal size  Bladder: No abnormal bladder wall thickening.    Bowel: Postoperative changes status post colectomy with right lower  quadrant ostomy. Multiple fluid-filled, distended bowel loops are present,  extending to the level of the ostomy. Fluid is present adjacent to the  ostomy site. Small bowel loops entering the ostomy site are minimally  kinked. Partial small bowel obstruction is possible.    Gallbladder and biliary  system: Gallstones are suspected. No abnormal  pericholecystic inflammatory changes.    Mesentery and lymph nodes: No free air or drainable fluid collection.    Bones: Grade 1 anterolisthesis L4-L5, centered to facet arthropathy.  Multilevel degenerative changes within the spine.      Impression    1. Postoperative changes status post colectomy with right lower quadrant  ostomy. Multiple dilated, fluid-filled small bowel loops are present,  extending to the level of the ostomy site. Findings could represent an  enteritis, ileus or partial bowel obstruction. Plain film follow-up  recommended.  2. Bibasilar atelectasis/consolidation with small pleural effusions.  3. More details and findings, as above.        The CT exam was performed using one or more the following a dose reduction  techniques: Automated exposure control, adjustment of the mA and/or kV  according to the patient's size, or use of iterative reconstruction  technique.        Microbiology / Pathology        Hospital Encounter on 02/16/17 (from the past 96 hour(s))  -URINE CULTURE,ROUTINE   Collection Time: 02/16/17  6:46 AM   Culture Result Status   URINE CULTURE No significant growth Preliminary     No results found for this visit on 02/16/17.     INPATIENT MEDICATIONS       Current Facility-Administered Medications:  diatrizoate meglumine & sodium (MD-GASTROVIEW/GASTROGRAFIN) oral solution 15 mL Oral See Comment   hydrALAZINE (APRESOLINE) injection 10 mg 10 mg Intravenous Q6H PRN   levothyroxine (SYNTHROID) 20 mcg/mL injection 50 mcg Intravenous QAM   NS flush syringe 3 mL Intracatheter Q8HRS   NS flush syringe 3 mL Intracatheter Q1H PRN   NS flush syringe 3 mL Intracatheter Q8HRS   NS flush syringe 3 mL Intracatheter Q1H PRN   NS premix infusion  Intravenous Continuous   ondansetron (ZOFRAN) 2 mg/mL injection 4 mg Intravenous Q6H PRN   pantoprazole (PROTONIX) 80 mg in NS 100 mL (tot vol) infusion  Intravenous Continuous       ASSESSMENT & PLAN          Active Hospital Problems    Diagnosis    Primary Problem: Small bowel obstruction    Anxiety    Upper GI bleed    Essential tremor    Asymptomatic microscopic hematuria    Hypertension    History of malignant neoplasm of large intestine    Ulcerative colitis (CMS HCC)    Hypothyroid     Partial small-bowel obstruction   General surgery consulted, recommend continuing conservative management    IV fluids at 110 cc/hour normal saline, monitor electrolytes    Continue NG tube to intermittent low suction   Strict NPO   Lactic acid 1.3 on admission   Zofran for nausea p.r.n.   Serial abdominal exams, Repeat abdominal xray pending for today    Await General surgery recommendations     Upper GI bleed   Continue Protonix drip   No bright red blood noted    Await general surgery recommendations   Holding DVT prophylaxis anticoagulation at this  time   Serial H&H, Hemoglobin 14.4    Microscopic asymptomatic hematuria   Incidental finding on urinalysis   No symptoms noted    Undetermined significance   Monitor, consider follow-up    Hypertension   Hold Norvasc 5 mg once a day due to NPO status   Hydralazine 10 mg IV p.r.n. with parameters ordered   Telemetry ordered per protocol for IV antihypertensives    Essential tremor   Patient states that at home he takes p.r.n. Propranolol, none for approximately one week   Will monitor     Anxiety   Patient states he intermittently uses Valium at home   Last taking Valium was approximately 1 week prior to admission per patient    No acute anxiety at this time, patient in no acute distress    Monitor closely, consider Ativan IV PRN if needed    Hypothyroidism   Patient takes Armour Thyroid 60 mg oral tablet once a day in the morning   Will hold for now given strict NPO status   UpToDate review suggests Armour Thyroid 60 mg PO is equivalent to Synthroid 100 mcg PO, Will half PO Synthroid dose and start IV replacement    Started IV Synthroid  50 mcg qday, will monitor     History of colon cancer/history of ulcerative colitis   Status post colectomy with Dr. Rowe Clack    Code status: Full code  Activity: Out of bed with assistance  IV fluids: Normal saline 110 cc/hour  Vitals: Q.4 hours  Consults: General surgery  Nursing: NG tube to intermittent low suction  Diet: Strict NPO  DVT Risk Factors:  Age greater than 40 and Immobility  DVT/PE Prophylaxis: SCDs/ Venodynes/Impulse boots    PT/OT: No  DVT/PE Prophylaxis: SCDs/ Venodynes/Impulse boots  DNR Status:  Full Code  Consults: General Surgery     Disposition Planning:   Await General surgery recommendations, serial abdominal exams, serial H&H, continue NG tube placement     Samella Parr, DO 07:25, 02/17/2017   PGY I  [facility]        I saw and evaluated the patient.  I have discussed the patient's case and management with the family medicine team.   I have reviewed the resident's progress note and agree with the findings and plan of care as documented.  Any exceptions/additions are edited/noted.  02/17/2017 10:45         Pearlie Oyster    [facility]  [address]  [place], [place] [address]  Phone: [phone]  Fax: [phone]

## 2017-02-18 LAB — CBC WITH DIFF
BASOPHIL #: 0 x10ˆ3/uL (ref 0.00–0.20)
BASOPHIL %: 0 %
EOSINOPHIL #: 0.1 x10ˆ3/uL (ref 0.00–0.50)
EOSINOPHIL %: 1 %
HCT: 39.9 % (ref 38.9–50.5)
HGB: 13.7 g/dL (ref 13.4–17.3)
HGB: 13.7 g/dL (ref 13.4–17.3)
LYMPHOCYTE #: 1 10*3/uL (ref 0.80–3.20)
LYMPHOCYTE %: 12 %
MCH: 33.1 pg (ref 27.9–33.1)
MCHC: 34.4 g/dL (ref 32.8–36.0)
MCV: 96.3 fL — ABNORMAL HIGH (ref 82.4–95.0)
MONOCYTE #: 1.2 10*3/uL — ABNORMAL HIGH (ref 0.20–0.80)
MONOCYTE %: 14 %
MPV: 8.6 fL (ref 6.0–10.2)
NEUTROPHIL #: 5.8 10*3/uL — ABNORMAL HIGH (ref 1.60–5.50)
NEUTROPHIL %: 73 %
NEUTROPHIL %: 73 %
PLATELETS: 188 10*3/uL (ref 140–440)
RBC: 4.14 x10ˆ6/uL — ABNORMAL LOW (ref 4.40–5.68)
RDW: 13.4 % (ref 10.9–15.1)
WBC: 8 x10ˆ3/uL (ref 3.3–9.3)

## 2017-02-18 LAB — BASIC METABOLIC PANEL
ANION GAP: 6 mmol/L
BUN/CREA RATIO: 25
BUN: 17 mg/dL (ref 10–25)
CALCIUM: 8.2 mg/dL (ref 8.2–10.2)
CHLORIDE: 105 mmol/L (ref 98–111)
CO2 TOTAL: 25 mmol/L (ref 21–35)
CREATININE: 0.67 mg/dL (ref <=1.30)
ESTIMATED GFR: >60 mL/min/1.73mˆ2
ESTIMATED GFR: >60 mL/min/{1.73_m2}
GLUCOSE: 99 mg/dL (ref 70–110)
POTASSIUM: 3.7 mmol/L (ref 3.5–5.0)
SODIUM: 136 mmol/L (ref 135–145)

## 2017-02-18 LAB — URINE CULTURE,ROUTINE: URINE CULTURE: NO GROWTH

## 2017-02-18 LAB — MAGNESIUM: MAGNESIUM: 2.1 mg/dL (ref 1.8–2.3)

## 2017-02-18 LAB — PHOSPHORUS: PHOSPHORUS: 2 mg/dL — ABNORMAL LOW (ref 2.5–4.5)

## 2017-02-18 MED ORDER — SODIUM PHOSPHATE 3 MMOL/ML INTRAVENOUS SOLUTION
30.0000 mmol | Freq: Once | INTRAVENOUS | Status: AC
Start: 2017-02-18 — End: 2017-02-18
  Administered 2017-02-18: 0 mmol via INTRAVENOUS
  Administered 2017-02-18: 30 mmol via INTRAVENOUS
  Filled 2017-02-18: qty 10

## 2017-02-18 MED ORDER — ACETAMINOPHEN 325 MG TABLET
650.00 mg | ORAL_TABLET | ORAL | Status: DC | PRN
Start: 2017-02-18 — End: 2017-02-18
  Administered 2017-02-18: 650 mg via ORAL
  Filled 2017-02-18: qty 2

## 2017-02-18 NOTE — Care Plan (Signed)
Problem: Fall Risk (Adult)  Goal: Absence of Falls  Patient will demonstrate the desired outcomes by discharge/transition of care.   Outcome: Ongoing (see interventions/notes)      Problem: Skin Injury Risk (Adult,Obstetrics,Pediatric)  Goal: Skin Health and Integrity  Patient will demonstrate the desired outcomes by discharge/transition of care.   Outcome: Ongoing (see interventions/notes)

## 2017-02-18 NOTE — Care Plan (Signed)
Problem: Patient Care Overview (Adult,OB)  Goal: Plan of Care Review(Adult,OB)  The patient and/or their representative will communicate an understanding of their plan of care   Outcome: Ongoing (see interventions/notes)    Goal: Individualization/Patient Specific Goal(Adult/OB)  Outcome: Ongoing (see interventions/notes)    Goal: Interdisciplinary Rounds/Family Conf  Outcome: Ongoing (see interventions/notes)      Problem: Fall Risk (Adult)  Goal: Absence of Falls  Patient will demonstrate the desired outcomes by discharge/transition of care.   Outcome: Ongoing (see interventions/notes)      Problem: Skin Injury Risk (Adult,Obstetrics,Pediatric)  Goal: Skin Health and Integrity  Patient will demonstrate the desired outcomes by discharge/transition of care.   Outcome: Ongoing (see interventions/notes)

## 2017-02-18 NOTE — Progress Notes (Addendum)
[place] [address]     Phone: [phone]  Fax: [place] NOTE     Patient Name: [name]  Date of service: 02/18/2017  Date of Admission:  02/16/2017  Hospital Day:  LOS: 2 days     SUMMARY     [name] is a 71 y.o. old male, with PMH of ulcerative colitis, HTN, Anxiety, Hypothyroidim, Partial Small Bowel Obstruction and Colon Cancer s/p colectomy with ileostomy placement approximately 3-4 years ago by Dr. Rowe Clack. Approximately 2 days ago patient began to experience sudden onset of nausea and vomiting. He was unable to tolerate p.o. He denied any significant abdominal pain at that time. Occurred spontaneously while he was doing his daily activities. Denied any fever or chills.  Denied any dysuria hematuria. Noted that his emesis was slightly dark. Notice decreased ostomy output. Came to the emergency room for evaluation. Of note, patient had a similar presentation in February 2018. He has a  history of circumferential ostomy hernia.    In emergency room, patient was noted on KUB to have a partial small-bowel obstruction. Urinalysis showed trace ketones and trace protein. Lactic acid was unremarkable. Mild leukocytosis was noted. LFTs were within normal limits. Kidney function was stable. Patient's emesis was tested for heme and found to be positive. Patient had an NG-tube placed on intermittent suction and a Protonix drip started. Vitals were stable knee emergency room other than slight hypertension. Patient was given a 1 L normal saline bolus.    Patient admitted for Partial Small Bowel Obstruction. General Surgery consulted and recommendations suggested continuation of conservative management. Patient changed to full liquid diet on 09/24 with NG tube discontinued per General Surgery. Replaced electrolytes as needed. CT of abdomen/pelvis on 09/23 revealed postoperative changes with fluid filled small bowel loops suggesting  partial small bowel obstruction. OMM performed on 09/24 by [place] ward team. Repeat abdominal xray on 09/24 revealed improvement in distended proximal small bowel. If patient continues to tolerate diet advancement, he will likely be discharged home within next 24 hrs per general surgery recommendations.     SUBJECTIVE     Patient seen and examined this AM. Patient did well with no overnight events reported. Patient reports no abdominal pain this morning. Patient continues to have good ileostomy output. Patient denies CP/SOB/N/V/D/C, or any other complaints at this time.    Diet - Strict NPO   Activity - OOB with Assistance   Code Status - Full Code    Review of systems:   All systems negative except as noted above.    OBJECTIVE     Vital Signs:  BP 129/80  Pulse 64  Temp 36.6 C (97.9 F)  Resp 18  Ht 1.838 m (6' 0.36")  Wt 72.6 kg (160 lb)  SpO2 94%  BMI 21.48 kg/m2    Physical Exam:  GENERAL: patient in no acute distress, appears stated age, vital signs reviewed  LUNGS: Clear to auscultation bilaterally without wheezing or rales appreciated in posterior lung fields   CARDIOVASCULAR:  regular rate and rhythm  no murmurs  ABDOMEN:  Abdomen soft, minimal tenderness to palpation, mild distention without guarding, bowel sounds present in all 4 quadrants, ostomy with appropriate output in RLQ without erythema   NEUROLOGICAL: negative findings: speech normal, mental status intact, cranial nerves 2-12 intact  EXTREMITIES:  extremities normal, atraumatic, no cyanosis or edema  Osteopathic Structural Exam: Patient noted to have chronic tissue texture changes along the lumbar spine, with T12-L2RrSl    I/O:    Intake/Output Summary (Last 24 hours) at 02/18/17 1018  Last data filed at 02/18/17 0740   Gross per 24 hour   Intake             2160 ml   Output             1850 ml   Net              310 ml       Blood Sugars:   No results for input(s): GLUIP in the last 48 hours.     LABS & IMAGING     CBC  Results    Recent Labs      02/16/17   0535  02/17/17   0428  02/18/17   0437   WBC  9.4*  7.4  8.0   HGB  16.2  14.4  13.7   HCT  47.5  42.3  39.9   PLTCNT  236  201  188      BMP Results    Recent Labs      02/16/17   0535  02/17/17   0428  02/18/17   0437   SODIUM  137  136  136   POTASSIUM  4.0  3.8  3.7   CHLORIDE  99  103  105   CO2  29  25  25    BUN  21  17  17    CREATININE  0.80  0.61  0.67   GFR  >60  >60  >60   ANIONGAP  9  8  6       Recent Labs      02/16/17   0535  02/17/17   0428  02/18/17   0437   CALCIUM  10.1  8.7  8.2   ALBUMIN  4.5   --    --    MAGNESIUM   --   1.9  2.1      Cardiac Results   No results for input(s): UHCEASTTROPI, CKMB, MBINDEX, BNP in the last 72 hours.   Liver/Pancrease Enzyme Results   Recent Labs      02/16/17   0535   TOTALPROTEIN  7.7   ALBUMIN  4.5   AST  15   ALT  15   ALKPHOS  52   LIPASE  31      Coagulation Studies    Recent Labs      02/17/17   0428   INR  1.27*      Lipid Panel Results    Lab Results   Component Value Date    CHOLESTEROL 178 07/13/2016    HDLCHOL 50 07/13/2016    LDLCHOLDIR 105 (H) 03/05/2016    TRIG 91 07/13/2016      Imaging Studies     Results for orders placed or performed during the hospital encounter of 02/16/17 (from the past 24 hour(s))   XR ABD FLAT AND UPRIGHT SERIES (W PA CHEST)     Status: None    Narrative    [name]    PROCEDURE DESCRIPTION: XR ABD FLAT AND UPRIGHT SERIES (W PA CHEST)    PROCEDURE PERFORMED DATE AND TIME: 02/17/2017 2:27 PM    CLINICAL INDICATION: partial small bowel obstruction follow up    TECHNIQUE: 3 views / 3 images submitted.    COMPARISON: 02/16/2017  FINDINGS:         Supine and upright projections of the abdomen and a frontal view of the  chest were obtained.    Cardiac and mediastinal silhouettes are within normal limits. No infiltrate  or edema is present. Atelectatic changes are now seen at the left base.  Enteric tube noted with the tip in the region of the stomach. 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  Distention of central small bowel loops is improved.  No free air is seen in the abdomen.       Impression    No evidence for acute process in the chest. Improvement in  distended proximal small bowel with placement of enteric tube.        Microbiology / Pathology        Hospital Encounter on 02/16/17 (from the past 96 hour(s))  -URINE CULTURE,ROUTINE   Collection Time: 02/16/17  6:46 AM   Culture Result Status   URINE CULTURE No significant growth 48 hours. Final     No results found for this visit on 02/16/17.     INPATIENT MEDICATIONS       Current Facility-Administered Medications:  acetaminophen (TYLENOL) tablet 650 mg Oral Q4H PRN   amLODIPine (NORVASC) tablet 5 mg Oral Daily   diatrizoate meglumine & sodium (MD-GASTROVIEW/GASTROGRAFIN) oral solution 15 mL Oral See Comment   NS flush syringe 3 mL Intracatheter Q8HRS   NS flush syringe 3 mL Intracatheter Q1H PRN   NS flush syringe 3 mL Intracatheter Q8HRS   NS flush syringe 3 mL Intracatheter Q1H PRN   NS premix infusion  Intravenous Continuous   ondansetron (ZOFRAN) 2 mg/mL injection 4 mg Intravenous Q6H PRN   pantoprazole (PROTONIX) 80 mg in NS 100 mL (tot vol) infusion  Intravenous Continuous   sodium phosphate 30 mmol in D5W 100 mL IVPB 30 mmol Intravenous Once   thyroid (ARMOUR THYROID) tablet 60 mg Oral Daily       ASSESSMENT & PLAN     Active Hospital Problems    Diagnosis   . Primary Problem: Small bowel obstruction   . Anxiety   . Upper GI bleed   . Essential tremor   . Asymptomatic microscopic hematuria   . Hypertension   . History of malignant neoplasm of large intestine   . Ulcerative colitis (CMS [place])   . Hypothyroid     Partial small-bowel obstruction   General surgery consulted, suggest home discharge today if patient tolerates full diet    IV fluids at 110 cc/hour normal saline, monitor electrolytes   Discontinued NG tube on 09/24   Performed OMM on 09/24: Gentle Mesenteric Lifts with Bilateral Rib Raising to Thoracic Spine    Diet:  Advanced to full, will monitor    Lactic acid 1.3 on admission   Zofran for nausea p.r.n.    Upper GI bleed   Continue Protonix drip   No bright red blood noted    Holding DVT prophylaxis anticoagulation at this time   Serial H&H, Hemoglobin 13.7     Microscopic asymptomatic hematuria   Incidental finding on urinalysis   No symptoms noted    Undetermined significance   Monitor, consider follow-up    Hypertension   Started home Norvasc 5 mg once a day    Discontinued Hydralazine 10 mg IV p.r.n.     Essential tremor   Patient states that at home he takes p.r.n. Propranolol, none for approximately one week   Will monitor     Anxiety  Patient states he intermittently uses Valium at home   Last taking Valium was approximately 1 week prior to admission per patient    No acute anxiety at this time, patient in no acute distress    Monitor closely, consider Ativan IV PRN if needed    Hypothyroidism   Started home Armour Thyroid 60 mg oral tablet once a day in the morning    History of colon cancer/history of ulcerative colitis   Status post colectomy with Dr. Rowe Clack    Code status: Full code  Activity: Out of bed with assistance  IV fluids: Normal saline 110 cc/hour  Vitals: Q.4 hours  Consults: General surgery  Nursing: NG tube discontinued   Diet: Full Regular Diet   DVT Risk Factors:  Age greater than 40 and Immobility  DVT/PE Prophylaxis: SCDs/ Venodynes/Impulse boots    PT/OT: No  DVT/PE Prophylaxis: SCDs/ Venodynes/Impulse boots  DNR Status:  Full Code  Consults: General Surgery     Disposition Planning:   General surgery recommendations suggest advancement to full diet today, will advance patient diet monitor, anticipate home discharge today if patient tolerates.       Samella Parr, DO 07:25, 02/18/2017   PGY I  [facility]            I saw and evaluated the patient.  I have discussed the patient's case and management with the family medicine team.   I have reviewed the  resident's progress note and agree with the findings and plan of care as documented.  Any exceptions/additions are edited/noted.  02/18/2017 10:27         Pearlie Oyster    [facility]  [address]  [place], [place] [address]  Phone: [phone]  Fax: [phone]

## 2017-02-18 NOTE — Discharge Summary (Addendum)
[place] [address]     Phone: [phone]  Fax: [place]     Date of Service:  02/18/2017  [name], 71 y.o. male  Date of Birth:  Jul 03, 1945  PCP: Jenel Lucks, MD    ADMISSION DATE:  02/16/2017  DISCHARGE DATE:  02/18/2017    ATTENDING PHYSICIAN: No att. providers found  PRIMARY CARE PHYSICIAN: Jenel Lucks, MD     DIAGNOSES     PRIMARY ADMISSION DIAGNOSIS: Small bowel obstruction    DISCHARGE DIAGNOSES:   Active Hospital Problems    Diagnosis Date Noted   . Principle Problem: Small bowel obstruction 02/16/2017   . Anxiety 02/17/2017   . Upper GI bleed 02/16/2017   . Essential tremor 02/16/2017   . Asymptomatic microscopic hematuria 02/16/2017   . Hypertension 04/07/2016   . 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 Ulcerative colitis (CMS [place]) 03/05/2013   . Hypothyroid 03/20/2012      Resolved Hospital Problems    Diagnosis    No resolved problems to display.     Active Non-Hospital Problems    Diagnosis Date Noted   . 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 old male, with PMH of ulcerative colitis, HTN, Anxiety, Hypothyroidim, Partial Small Bowel Obstruction and Colon Cancer s/p colectomy with ileostomy placement approximately 3-4 years ago by Dr. Rowe Clack. Approximately 2 days ago patient began to experience sudden onset of nausea and vomiting. He was unable to tolerate p.o. He denied any significant abdominal pain at that time. Occurred spontaneously while he was doing his daily activities. Denied any fever or chills. Denied any dysuria hematuria. Noted that his emesis was slightly dark. Notice decreased ostomy output. Came to the emergency room for evaluation. Of note, patient had a similar presentation in February 2018. He has a  history of circumferential ostomy hernia.    In emergency room, patient was noted  on KUB to have a partial small-bowel obstruction.Urinalysis showed trace ketones and trace protein. Lactic acid was unremarkable. Mild leukocytosis was noted. LFTs were within normal limits. Kidney function was stable. Patient's emesis was tested for heme and found to be positive. Patient had an NG-tube placed on intermittent suction and a Protonix drip started. Vitals were stable knee emergency room other than slight hypertension.Patient was given a 1 L normal saline bolus.    Patient admitted for Partial Small Bowel Obstruction. General Surgery consulted and recommendations suggested continuation of conservative management. Patient changed to full liquid diet on 09/24 with NG tube discontinued per General Surgery. Replaced electrolytes as needed. CT of abdomen/pelvis on 09/23 revealed postoperative changes with fluid filled small bowel loops suggesting partial small bowel obstruction. OMM performed on 09/24 by [place] ward team. Repeat abdominal xray on 09/24 revealed improvement in distended proximal small bowel. Patient tolerated advancement of diet per general surgery recommendations. Patient cleared for home discharge from general surgery standpoint.     The patient was ready and stable for discharge on 02/18/2017. At the time of discharge, all the symptoms which brought the patient to the hospital had subsided, the patient was in stable condition, and the patient was ready and willing to go home.    CONSULTATIONS:   General Surgery  PROCEDURES PERFORMED:   No major procedures performed.    PHYSICAL EXAM AT DISCHARGE     Temperature: 36.6 C (97.9 F)  Heart Rate: 64  BP (Non-Invasive): 129/80  Respiratory Rate: 18  SpO2-1: 94 %  Pain Score (Numeric, Faces): 0     Physical Exam   Constitutional: He is oriented to person, place, and time and well-developed, well-nourished, and in no distress.   HENT:   Head: Normocephalic and atraumatic.   Eyes: Pupils are equal, round, and reactive to light.  Conjunctivae are normal.   Neck: Normal range of motion. Neck supple. No JVD present. No tracheal deviation present.   Cardiovascular: Normal rate, regular rhythm and normal heart sounds.    No murmur heard.  Pulmonary/Chest: Effort normal and breath sounds normal. No respiratory distress. He has no wheezes. He has no rales. He exhibits no tenderness.   Abdominal: Soft. Bowel sounds are normal. He exhibits no distension. There is no tenderness. There is no rebound and no guarding.   RLQ osteomy with appropriate output, absence of erythema noted around osteomy site    Musculoskeletal: Normal range of motion. He exhibits no edema or tenderness.   Neurological: He is alert and oriented to person, place, and time. No cranial nerve deficit. Coordination normal.   Skin: Skin is warm and dry. No rash noted. No erythema.   Psychiatric: Mood, memory, affect and judgment normal.       SIGNIFICANT LABS / IMAGING     SIGNIFICANT LAB:   On Admission: WBCs 9.4, H&H 16.2/47.5, Platelets 236  Upon Discharge: WBCs 8.0, H&H 13.7/39.9, Platelets 188     SIGNIFICANT RADIOLOGY:   PROCEDURE DESCRIPTION: XR ABD FLAT AND UPRIGHT SERIES (W PA CHEST)    PROCEDURE PERFORMED DATE AND TIME: 02/16/2017 5:25 AM    CLINICAL INDICATION: abd pain    TECHNIQUE: 3 views / 3 images submitted.    COMPARISON: 04/08/2016      FINDINGS: The heart size and pulmonary vasculature appear stable. Chronic  changes are present within the lungs. No focal consolidation or pulmonary  edema.    Right lower quadrant ostomy. Air-fluid levels are present within distended  small bowel loops. Small bowel distention measures up to 4.8 cm within the  central abdomen. Findings raise concern for at least partial small bowel  obstruction. Close follow-up recommended.    IMPRESSION:  1. Chronic changes within the lungs without focal consolidation or  pulmonary edema.  2. Postoperative changes within the abdomen. Air-fluid levels within  distended small bowel loops,  raising concern for partial small bowel  obstruction. Close follow-up recommended.    PROCEDURE DESCRIPTION: CT ABDOMEN PELVIS WO IV CONTRAST    PROCEDURE PERFORMED DATE AND TIME: 02/17/2017 1:42 AM    CT DOSE INFORMATION:  DLP (mGycm):  390    CLINICAL INDICATION: Small bowel obstruction,    COMPARISON: X-ray abdomen flat and upright series 02/16/2017      FINDINGS:     Lung bases: Coronary artery calcifications. Bibasilar  atelectasis/consolidation with small pleural effusions, left greater than  right.    Liver: Fatty infiltration changes within the liver.    Stomach: An enteric tube is in place within the stomach.    Pancreas: No acute process.    Adrenal glands: No acute process.    Kidneys: Mild chronic changes within the kidneys. No hydronephrosis.  Bilateral renal cortical low-density lesions, favor a renal cyst.    Spleen: Normal size    Bladder: No abnormal bladder wall  thickening.    Bowel: Postoperative changes status post colectomy with right lower  quadrant ostomy. Multiple fluid-filled, distended bowel loops are present,  extending to the level of the ostomy. Fluid is present adjacent to the  ostomy site. Small bowel loops entering the ostomy site are minimally  kinked. Partial small bowel obstruction is possible.    Gallbladder and biliary system: Gallstones are suspected. No abnormal  pericholecystic inflammatory changes.    Mesentery and lymph nodes: No free air or drainable fluid collection.    Bones: Grade 1 anterolisthesis L4-L5, centered to facet arthropathy.  Multilevel degenerative changes within the spine.    IMPRESSION:  1. Postoperative changes status post colectomy with right lower quadrant  ostomy. Multiple dilated, fluid-filled small bowel loops are present,  extending to the level of the ostomy site. Findings could represent an  enteritis, ileus or partial bowel obstruction. Plain film follow-up  recommended.  2. Bibasilar atelectasis/consolidation with small pleural  effusions.  3. More details and findings, as above.    PROCEDURE DESCRIPTION: XR ABD FLAT AND UPRIGHT SERIES (W PA CHEST)    PROCEDURE PERFORMED DATE AND TIME: 02/17/2017 2:27 PM    CLINICAL INDICATION: partial small bowel obstruction follow up    TECHNIQUE: 3 views / 3 images submitted.    COMPARISON: 02/16/2017      FINDINGS:         Supine and upright projections of the abdomen and a frontal view of the  chest were obtained.    Cardiac and mediastinal silhouettes are within normal limits. No infiltrate  or edema is present. Atelectatic changes are now seen at the left base.  Enteric tube noted with the tip in the region of the stomach. 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 Distention of central small bowel loops is improved.  No free air is seen in the abdomen.     IMPRESSION:  No evidence for acute process in the chest. Improvement in  distended proximal small bowel with placement of enteric tube.    DISCHARGE MEDS /  INSTRUCTIONS     DISCHARGE MEDICATIONS:     Current Discharge Medication List      CONTINUE these medications - NO CHANGES were made during your visit.       Details    amLODIPine 5 mg Tablet   Commonly known as:  NORVASC    5 mg, Oral, Daily   Qty:  90 Tab   Refills:  1       CALCIUM 600 + D(3) ORAL    Take by mouth   Refills:  0       cholecalciferol (vitamin D3) 1,000 unit Tablet    1,000 Units, Daily   Refills:  0       diazePAM 5 mg Tablet   Commonly known as:  VALIUM    5 mg, Oral, Q6H PRN   Refills:  0       ibuprofen 400 mg Tablet   Commonly known as:  MOTRIN    400 mg, Oral, 4x/day PRN   Refills:  0       propranolol 10 mg Tablet   Commonly known as:  INDERAL    10 mg, Oral, See Comment, Take one tab PO every 6 hours as needed for tremor   Qty:  60 Tab   Refills:  3       Sildenafil 50 mg Tablet   Commonly known as:  VIAGRA    25 mg, Oral, Q24 H PRN  Qty:  7 Tab   Refills:  0       thyroid 60 mg Tablet   Commonly known as:  ARMOUR THYROID    60 mg, Oral, Daily   Refills:  0              DISCHARGE INSTRUCTIONS:     DISCHARGE INSTRUCTION - DIET   Diet: RESUME HOME DIET      PLEASE KEEP FOLLOW-UP APPT ALREADY SCHEDULED IN [facility]   Please keep your appointment with your follow-up provider. If for some reason you need to cancel, be sure to reschedule as this is important for your care. You have a follow up appointment with Dr. Leotis Pain on Wednesday, October 3rd, 2018 at 1:30 PM   [place]   Follow-up in: 2 WEEKS    Reason for visit: [place] ON DISCHARGE:   Alert, Oriented and VS Stable    DISCHARGE DISPOSITION:    Home discharge     RECOMMENDATIONS and Follow Up:   Patient to follow up with [place] with Dr. Leotis Pain on Wednesday October 3rd, 2018 at 1:30 PM. Patient to follow up with Dr. Rowe Clack General Surgery approximately 2 weeks status post hospital discharge.     I spent more than 30 minutes discharging this patient, including final assessment, diagnosis and treatment of the patient; discussion of hospital course and discharge instructions with the patient and/or family; and arrangements for follow-up care, including appointments, needed prescriptions, referrals, and/or preparation of discharge records.      cc: Primary Care Physician:  Jenel Lucks, MD  [place]  [place] [address]      OF:[id number] Physician:  No referring provider defined for this encounter.     Zane Dennison, DO 9/25/201810:19  [facility]      Addendum:   Upper GI bleeding resolved prior to discharge, likely secondary to esophagitis from severe vomiting.    Samella Parr, [place]   [address] [place] [address]  Dept: [phone]  Dept Fax: [phone]  I saw and evaluated the patient.  I have discussed the patient's case and management  with the family medicine team.   I have reviewed the resident's progress note and agree with the findings and plan of care as documented.  Any exceptions/additions are edited/noted.  02/22/2017 10:08         Pearlie Oyster    [facility]  [address]  [place], [place] [address]  Phone: [phone]  Fax: [phone]

## 2017-02-18 NOTE — Nurses Notes (Signed)
Discharge instructions discussed with patient and family with understanding voiced Joel Mericle Wilt, RN

## 2017-02-18 NOTE — Consults (Signed)
[facility]  Surgery Progress Note    [name],[name], 71 y.o. male  Date of Birth:  21-Sep-1945  Date of Admission:  02/16/2017  Date of service: 02/18/2017      Assessment/ Plan:  Active Hospital Problems    Diagnosis   . Primary Problem: Small bowel obstruction   . Anxiety   . Upper GI bleed   . Essential tremor   . Asymptomatic microscopic hematuria   . Hypertension   . History of malignant neoplasm of large intestine   . Ulcerative colitis (CMS [place])   . Hypothyroid     Resolved small bowel obstruction.  -continue regular diet  -no need for follow-up unless further complaints.  -stable for discharge home from surgical standpoint.    Marguerita Beards, MD    Subjective     Patient doing well.  Ileostomy functioning without difficulty.  No abdominal pain.  Tolerating diet.  Current Medications:    Current Facility-Administered Medications:  acetaminophen (TYLENOL) tablet 650 mg Oral Q4H PRN   amLODIPine (NORVASC) tablet 5 mg Oral Daily   diatrizoate meglumine & sodium (MD-GASTROVIEW/GASTROGRAFIN) oral solution 15 mL Oral See Comment   NS flush syringe 3 mL Intracatheter Q8HRS   NS flush syringe 3 mL Intracatheter Q1H PRN   NS flush syringe 3 mL Intracatheter Q8HRS   NS flush syringe 3 mL Intracatheter Q1H PRN   NS premix infusion  Intravenous Continuous   ondansetron (ZOFRAN) 2 mg/mL injection 4 mg Intravenous Q6H PRN   pantoprazole (PROTONIX) 80 mg in NS 100 mL (tot vol) infusion  Intravenous Continuous   sodium phosphate 30 mmol in D5W 100 mL IVPB 30 mmol Intravenous Once   thyroid (ARMOUR THYROID) tablet 60 mg Oral Daily       Objective     Vital Signs:  Temp (24hrs) Max:37.2 C (99 F)      Systolic (63ZCH), YIF:027 , Min:113 , XAJ:287     Diastolic (86VEH), MCN:47, Min:62, Max:87    Temp  Avg: 36.8 C (98.3 F)  Min: 36.6 C (97.9 F)  Max: 37.2 C (99 F)  Pulse  Avg: 80.3  Min: 76  Max: 86  Resp  Avg: 17.5  Min: 16  Max: 18  SpO2  Avg: 95.8 %  Min: 93 %  Max: 98 %  Pain Score (Numeric, Faces): 0    Today's  Physical Exam:  Temperature: 36.6 C (97.9 F)  Heart Rate: 76  BP (Non-Invasive): 127/62  Respiratory Rate: 18  SpO2-1: 98 %  Pain Score (Numeric, Faces): 0  CVS: Regular rate and rhythm  LUNGS: Clear to auscultation  ABD:   Soft, ileostomy is functional with gas and liquid stool in bag.  Nontender.    I/O:  I/O last 24 hours:    Intake/Output Summary (Last 24 hours) at 02/18/17 0740  Last data filed at 02/18/17 0600   Gross per 24 hour   Intake             2160 ml   Output             2850 ml   Net             -690 ml     I/O current shift:  09/25 0000 - 09/25 0759  In: 120 [P.O.:120]  Out: 1100 [Stool:1100]      Labs:  Results for orders placed or performed during the hospital encounter of 02/16/17 (from the past 24 hour(s))   CBC/DIFF  Narrative    The following orders were created for panel order CBC/DIFF.  Procedure                               Abnormality         Status                     ---------                               -----------         ------                     CBC WITH GUYQ[[id number]]                Abnormal            Final result                 Please view results for these tests on the individual orders.   BASIC METABOLIC PANEL   Result Value Ref Range    SODIUM 136 135 - 145 mmol/L    POTASSIUM 3.7 3.5 - 5.0 mmol/L    CHLORIDE 105 98 - 111 mmol/L    CO2 TOTAL 25 21 - 35 mmol/L    ANION GAP 6 mmol/L    CALCIUM 8.2 8.2 - 10.2 mg/dL    GLUCOSE 99 70 - 110 mg/dL    BUN 17 10 - 25 mg/dL    CREATININE 0.67 <=1.30 mg/dL    BUN/CREA RATIO 25     ESTIMATED GFR >60 Avg: 75 mL/min/1.86m^2   MAGNESIUM   Result Value Ref Range    MAGNESIUM 2.1 1.8 - 2.3 mg/dL   PHOSPHORUS   Result Value Ref Range    PHOSPHORUS 2.0 (L) 2.5 - 4.5 mg/dL   CBC WITH DIFF   Result Value Ref Range    WBC 8.0 3.3 - 9.3 x10^3/uL    RBC 4.14 (L) 4.40 - 5.68 x10^6/uL    HGB 13.7 13.4 - 17.3 g/dL    HCT 39.9 38.9 - 50.5 %    MCV 96.3 (H) 82.4 - 95.0 fL    MCH 33.1 27.9 - 33.1 pg    MCHC 34.4 32.8 - 36.0 g/dL    RDW 13.4 10.9 -  15.1 %    PLATELETS 188 140 - 440 x10^3/uL    MPV 8.6 6.0 - 10.2 fL    NEUTROPHIL % 73 %    LYMPHOCYTE % 12 %    MONOCYTE % 14 %    EOSINOPHIL % 1 %    BASOPHIL % 0 %    NEUTROPHIL # 5.80 (H) 1.60 - 5.50 x10^3/uL    LYMPHOCYTE # 1.00 0.80 - 3.20 x10^3/uL    MONOCYTE # 1.20 (H) 0.20 - 0.80 x10^3/uL    EOSINOPHIL # 0.10 0.00 - 0.50 x10^3/uL    BASOPHIL # 0.00 0.00 - 0.20 x10^3/uL         Radiology Tests :  Results for orders placed or performed during the hospital encounter of 02/16/17 (from the past 24 hour(s))   XR ABD FLAT AND UPRIGHT SERIES (W PA CHEST)     Status: None    Narrative    [name]    PROCEDURE DESCRIPTION: XR ABD FLAT AND UPRIGHT SERIES (W PA CHEST)  PROCEDURE PERFORMED DATE AND TIME: 02/17/2017 2:27 PM    CLINICAL INDICATION: partial small bowel obstruction follow up    TECHNIQUE: 3 views / 3 images submitted.    COMPARISON: 02/16/2017      FINDINGS:         Supine and upright projections of the abdomen and a frontal view of the  chest were obtained.    Cardiac and mediastinal silhouettes are within normal limits. No infiltrate  or edema is present. Atelectatic changes are now seen at the left base.  Enteric tube noted with the tip in the region of the stomach. 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 Distention of central small bowel loops is improved.  No free air is seen in the abdomen.       Impression    No evidence for acute process in the chest. Improvement in  distended proximal small bowel with placement of enteric tube.               Marguerita Beards, MD

## 2017-02-18 NOTE — Nurses Notes (Signed)
Pt. Alert and oriented. Pt has ileostomy. VS BP 127/62  Pulse 76  Temp 36.6 C (97.9 F)  Resp 18  Ht 1.838 m (6' 0.36")  Wt 72.6 kg (160 lb)  SpO2 98%  BMI 21.48 kg/m2 This patient currently meets requirements for low to mid level nursing care. The patient will continue to be monitored and re-evaluated for any changes in condition. Resting in bed comfortably.  All questions answered. Theda Belfast, RN

## 2017-02-19 ENCOUNTER — Other Ambulatory Visit (HOSPITAL_BASED_OUTPATIENT_CLINIC_OR_DEPARTMENT_OTHER): Payer: Self-pay

## 2017-02-19 ENCOUNTER — Emergency Department
Admission: EM | Admit: 2017-02-19 | Discharge: 2017-02-19 | Disposition: A | Discharge status: Home / Self Care | Payer: BLUE CROSS/BLUE SHIELD | Attending: Emergency Medicine | Admitting: Emergency Medicine

## 2017-02-19 ENCOUNTER — Encounter (HOSPITAL_COMMUNITY): Payer: Self-pay

## 2017-02-19 ENCOUNTER — Emergency Department (HOSPITAL_COMMUNITY): Payer: BLUE CROSS/BLUE SHIELD

## 2017-02-19 DIAGNOSIS — M436 Torticollis: Secondary | ICD-10-CM | POA: Insufficient documentation

## 2017-02-19 DIAGNOSIS — Z5181 Encounter for therapeutic drug level monitoring: Secondary | ICD-10-CM | POA: Insufficient documentation

## 2017-02-19 DIAGNOSIS — Z7189 Other specified counseling: Secondary | ICD-10-CM

## 2017-02-19 DIAGNOSIS — Z79899 Other long term (current) drug therapy: Secondary | ICD-10-CM | POA: Insufficient documentation

## 2017-02-19 DIAGNOSIS — M542 Cervicalgia: Secondary | ICD-10-CM

## 2017-02-19 MED ORDER — METHOCARBAMOL 500 MG TABLET: 500 mg | Tab | Freq: Four times a day (QID) | ORAL | 0 refills | 0 days | Status: AC

## 2017-02-19 MED ORDER — LIDOCAINE 4 % TOPICAL PATCH
1.0000 | MEDICATED_PATCH | Freq: Every day | CUTANEOUS | Status: DC
Start: 2017-02-19 — End: 2017-02-19
  Administered 2017-02-19: 1 via TRANSDERMAL
  Filled 2017-02-19: qty 1

## 2017-02-19 MED ADMIN — sodium hypochlorite 0.0125 % topical solution: @ 10:00:00 | NDC 00436100808

## 2017-02-19 NOTE — Nursing Note (Signed)
NAME:  [name]  DOB:  25-Jul-1945  AGE:  71 y.o.  MRN:  [id number]    Contact Attempt #1: 02/19/2017    Patient discharged from [facility] on 02/18/2017.  Patient is scheduled for a hospital follow up visit on 02/26/2017 with Dr. Leotis Pain. Discharge diagnosis: SBO.  No answer to phone call.  Left message asking patient to call clinic.  Will try contacting the patient again later today or tomorrow.     TCM Flowsheet Initiated:   Transitional Care Management Contact Type Phone Call Attempt Prescriptions filled? Confirmed Time/Date/Place of Appointment? Is appointment within 14 days of discharge? DME Equipment?   02/19/2017 Telephone Message left 1 - - - -      Transitional Care Management (Continued) Home Health Ordered?   02/19/2017 -     Adelene Amas, RN CM  Disease Management Coordinator

## 2017-02-19 NOTE — ED Nurses Note (Signed)
Pt reports to ER with c/o "stiff neck since yesterday." States, "he was recently admitted and [place] yesterday." States, "while in the hospital he was starting to have a stiff neck, it got much worse last evening around 2100." Denies any injury. Report given to Dorethea Clan, Primary RN.

## 2017-02-19 NOTE — ED Nurses Note (Signed)
Pt given discharge instructions. Pt voiced understanding of home care and s/s to that  would necessitate return to the ED. All questions answered. Pt denied any further needs.

## 2017-02-19 NOTE — Discharge Instructions (Signed)
Informational handouts given on discharge.  Resume home diet, as tolerated.  Gradually increase activity, as tolerated.  Avoid driving, operating heavy machinery, swimming, alcohol, narcotics while using Robaxin  Patient/patient's family was advised to return to the ED with any new, concerning or worsening symptoms and follow up as directed. The patient/patient's family verbalized understanding of all instructions and had no further questions or concerns.     Prescriptions:   New Prescriptions    METHOCARBAMOL (ROBAXIN) 500 MG ORAL TABLET    Take 1 Tab (500 mg total) by mouth Four times a day for 5 days       Follow Up:    [place]  [place] [id number]  [phone]    As needed, If symptoms worsen    Jenel Lucks, [place]  [address]  [place] [address]  [phone]    Call      Future Appointments  Date Time Provider [place]   02/26/2017 1:30 PM Gaylan Gerold [facility]   03/19/2017 2:00 PM Marguerita Beards, MD [facility]

## 2017-02-19 NOTE — ED Provider Notes (Signed)
[place]  Emergency Department  Attending Note    Identification:  Name: [name]  Age and Gender: 71 y.o. male  Date of Birth: [birth year]  Date of Service: 02/19/2017  MRN: [id number]  PCP: Darrell Lucks, MD    Code Status: Full     Arrival: The patient arrived by private car and is alone.    History Obtained from: Patient    History Limitations: none    CC:  Stiff neck     HPI:  [name] is a 71 y.o. White male presenting with stiff neck since yesterday. Pt states after being discharged from the hospital pain in neck has worsened causing him not to be able to turn his head. Of note pt was admitted to the hospital for small bowel obstruction and had NG-tube placed. Pt states he believes stiff neck occurred because of the way he was laying in his hospital bed. Pt endorses hx of colon cancer, initially dx in 2010. Pt denies tobacco use or alcohol consumption.       ROS:  Constitutional: - fever, - chills, - weakness   Skin: - rash, - diaphoresis  HENT: - headaches, - congestion  Eyes: - vision changes   Cardiovascular: - chest pain, - palpitations, - edema  Respiratory: - cough, - wheezing, - SOB  GI: - nausea, - vomiting, - diarrhea, - constipation, - abdominal pain  GU: - dysuria, - hematuria, - polyuria  MSK: - joint pain, - back pain, + neck pain/stiff neck   Neuro: - loss of sensation, - confusion, - focal deficits, - numbness, - tingling  Psychiatric: - mood changes. - SI/HI/AVH. - EtOH, - tobacco, - marijuana, - cocaine, - amphetamines, - heroin, - opiates.  All other systems reviewed and are negative.    Medications:  Medications reviewed with patient/patient's family.  Medications Prior to Admission     Prescriptions    amLODIPine (NORVASC) 5 mg Oral Tablet    Take 1 Tab (5 mg total) by mouth Once a day    CALCIUM CARBONATE/VITAMIN D3 (CALCIUM 600 + D,3, ORAL)    Take by mouth    cholecalciferol, vitamin D3, 1,000 unit Oral Tablet    Take 1,000 Units by mouth Once a day    diazePAM (VALIUM) 5  mg Oral Tablet    Take 5 mg by mouth Every 6 hours as needed for Anxiety    ibuprofen (MOTRIN) 400 mg Oral Tablet    Take 400 mg by mouth Four times a day as needed for Pain    propranolol (INDERAL) 10 mg Oral Tablet    Take 1 Tab (10 mg total) by mouth Per instructions Take one tab PO every 6 hours as needed for tremor    Sildenafil (VIAGRA) 50 mg Oral Tablet    Take 0.5 Tabs (25 mg total) by mouth Every 24 hours as needed    thyroid (ARMOUR THYROID) 60 mg Oral Tablet    Take 60 mg by mouth Once a day          Allergies:  Allergies reviewed with patient/patient's family.  No Known Allergies    PMSFSH:  Past medical, surgical, family and social history reviewed with patient/patient's family.  Past Medical History:   Diagnosis Date   . Colon cancer (CMS HCC)    . H/O colon cancer, stage I    . 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 Hypothyroid    . Osteoporosis    .  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 Ulcerative colitis (CMS [place]) 03/05/2013     Past Surgical History:   Procedure Laterality Date   . Colonoscopy     . Hx other  2015   . Hx small bowel resection  2010   . Ileostomy       No family history on file.  Social History     Social History   . Marital status: Married     Spouse name: N/A   . Number of children: N/A   . Years of education: N/A     Occupational History   .  [employer]     Social History Main Topics   . Smoking status: Never Smoker   . Smokeless tobacco: Never Used   . Alcohol use No   . Drug use: No   . Sexual activity: Yes     Partners: Female     Other Topics Concern   . Not on file     Social History Narrative       Pertinent Physical Exam:   All nurse's notes reviewed.  BP (!) 161/87  Pulse 88  Temp 36.9 C (98.4 F)  Resp 18  Ht 1.803 m (5\' 11" )  Wt 72.6 kg (160 lb)  SpO2 99%  BMI 22.32 kg/m2  Constitutional: NAD. A&Ox3. Good historian. No family at bedside.  Head: NC/AT. MMM. PERRLA. EOMI.  Neck: Limited ROM secondary to soreness in anterior  cervical muscles with TTP. No JVD. No adenopathy.  Heart: RRR. No m/r/g.  Lungs: CTAB. No distress.  Abdomen: +BS. Soft. NT/ND. Ileostomy to R stomach  Musculoskeletal: No midline tenderness. No deformity or edema.  Skin: Warm and dry. No rash, erythema, jaundice, cyanosis.  Neurologic: Facies symmetric. Able to raise eyebrows, close eyes, smile, puff cheeks, stick out tongue, move tongue left and right, and raise palate symmetrically. Able to shrug shoulders symmetrically. PERRLA. EOMI. SILT to forehead, below eye, and at jawline. Can hear soft noise bilaterally. Good finger to nose, rapid alternating movement, heal to shin. Gait with normal base. Shows normal tandem, heel, and toe walking. Romberg and pronator drift negative. Reflexes 2+ bilaterally with plantar reflexes downgoing. Mental status oriented x3.  Psychiatric: Normal affect and mood.        Orders:  Results up to the Time the Disposition was Entered   MEASURE POST VOID RESIDUAL   XR CERVICAL SPINE COMPLETE (4 OR MORE VIEWS)    Narrative:     [name]    PROCEDURE DESCRIPTION: XR CERVICAL SPINE COMPLETE (4 OR MORE VIEWS)    PROCEDURE PERFORMED DATE AND TIME: 02/19/2017 8:56 AM    CLINICAL INDICATION: neck pain    TECHNIQUE: 6 views / 6 images submitted.      FINDINGS:     Mild to moderate degenerative changes are seen of the mid to lower cervical  spine. No acute abnormality detected slight anterior position of C4 on 5  based on degenerative facet changes. Moderate narrowing of C5-6. There is  fusion of C6/7. Prevertebral soft tissues normal. Mild foraminal narrowing.     lidocaine 4% patch (1 Patch Transdermal Patch Applied 02/19/17 0856)         Therapy/Procedures/Course/MDM:   Patient seen and examined.  Vital signs stable and within normal limits except for slight hypertension.  Physical exam as above.  Patient given Lidoderm patch.  X-ray neck shows mild to moderate degenerative changes with slight anterolisthesis of C4 on C5 and moderate  narrowing at C5-C6.  Given patient's physical examination, this is not consistent with any sort of midline disease or pathological process given that he is having muscular tenderness in his anterior cervical muscles.  Postvoid residual 14.  Results were discussed with the patient.  He was given the opportunity to ask questions.  Had lengthy discussion with patient about supportive care return precautions.  Prescription written for Robaxin.  Patient to follow up primary care physician within the next 2-3 days for further evaluation management.  All questions answered.  Patient agreeable plan.    Consults:   None    Impression:   Encounter Diagnosis   Name Primary?   . Neck pain Yes       Disposition:    Discharged. Patient/patient's family was advised to return to the ED with any new, concerning or worsening symptoms and follow up as directed. The patient/patient's family verbalized understanding of all instructions and had no further questions or concerns.     I am scribing for, and in the presence of, Dr. Dava Najjar for services provided on 02/19/2017.  Milinda Antis, SCRIBE     I personally performed the services described in this documentation, as scribed in my presence, it is both accurate and complete.    Ellan Lambert, MD  02/19/2017, 10:45  [place] [facility]    Parts of this patient's chart was completed in a retrospective fashion due to simultaneous direct patient care in the Emergency Department.   This note was partially generated using MModal Fluency Direct system, and there may be some incorrect words, spelling, and punctuation that were not noted before saving.

## 2017-02-20 ENCOUNTER — Other Ambulatory Visit (HOSPITAL_BASED_OUTPATIENT_CLINIC_OR_DEPARTMENT_OTHER): Payer: Self-pay

## 2017-02-20 DIAGNOSIS — Z7189 Other specified counseling: Secondary | ICD-10-CM

## 2017-02-20 NOTE — Nursing Note (Signed)
NAME:  [name]  DOB:  1946-04-11  AGE:  71 y.o.  MRN:  [id number]    Contact Made: 02/20/2017    Patient discharged from [facility] on 02/18/2017.  Patient is scheduled for a hospital follow up visit on 02/26/2017 with Dr. Leotis Pain. Discharge diagnosis: SBO.  There were no medication changes during the patient's hospitalization.     The patient confirms intention to present for hospital follow up visit as scheduled.  The patient stated he had to go to the ER yesterday because he had a stiff neck. He was given a muscle relaxant and he stated his neck his much better today.    The patient received discharge instructions in the hospital to follow up with the following specialist: Surgeon on 03/19/2017.     The patient did not have home health ordered.      The patient was encouraged to call the [place] clinic with any new or worsening symptoms and reminded to call the clinic during office hours or to call the hospital to page the resident on call after office hours.      TCM Flowsheet Completed:   Transitional Care Management Contact Type Phone Call Attempt Prescriptions filled? Confirmed Time/Date/Place of Appointment? Is appointment within 14 days of discharge? DME Equipment?   02/20/2017 Telephone Answer 2 N/A Yes Yes N/A      Transitional Care Management (Continued) Home Health Ordered?   02/20/2017 No     Adelene Amas, RN CM  Disease Management Coordinator

## 2017-02-26 ENCOUNTER — Inpatient Hospital Stay (HOSPITAL_BASED_OUTPATIENT_CLINIC_OR_DEPARTMENT_OTHER): Payer: Self-pay

## 2017-03-19 ENCOUNTER — Ambulatory Visit: Payer: BLUE CROSS/BLUE SHIELD | Attending: Surgery | Admitting: Surgery

## 2017-03-19 ENCOUNTER — Encounter (HOSPITAL_BASED_OUTPATIENT_CLINIC_OR_DEPARTMENT_OTHER): Payer: Self-pay | Admitting: Surgery

## 2017-03-19 VITALS — BP 132/76 | HR 78 | Temp 98.2°F | Ht 71.0 in | Wt 160.0 lb

## 2017-03-19 DIAGNOSIS — Z6822 Body mass index (BMI) 22.0-22.9, adult: Secondary | ICD-10-CM

## 2017-03-19 DIAGNOSIS — Z8719 Personal history of other diseases of the digestive system: Principal | ICD-10-CM

## 2017-03-19 DIAGNOSIS — Z09 Encounter for follow-up examination after completed treatment for conditions other than malignant neoplasm: Secondary | ICD-10-CM

## 2017-03-19 NOTE — H&P (Signed)
[facility] History & Physical    Patient: [name]  D.O.B.: 10-22-1945  MRN# [id number]  Date of Service: 03/19/2017  REFERRING PROVIDER: Vincent Peyer    HPI  ABID BOLLA is a 71 y.o. male who was seen today for Follow Up Small Bowel Obstruction. Believes that constipation was caused by OTC sleep aid. Patient is a complicated medical history significant for ulcerative colitis, colon cancer undergoing surgical resection approximately 3 years ago, ileostomy placement since that time. Reports he is avoiding sleep aid, promoting bowel movements with stool softeners, and increasing fluids. States he is feeling well.     Past Medical History:   Diagnosis Date   . Colon cancer (CMS HCC)    . H/O colon cancer, stage I    . 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 Hypothyroid    . Osteoporosis    . 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 Ulcerative colitis (CMS [place]) 03/05/2013          Past Surgical History:   Procedure Laterality Date   . COLONOSCOPY     . HX OTHER  2015    Proctolectomy    . HX SMALL BOWEL RESECTION  2010   . ILEOSTOMY            Current Outpatient Prescriptions   Medication Sig   . amLODIPine (NORVASC) 5 mg Oral Tablet Take 1 Tab (5 mg total) by mouth Once a day   . CALCIUM CARBONATE/VITAMIN D3 (CALCIUM 600 + D,3, ORAL) Take by mouth   . cholecalciferol, vitamin D3, 1,000 unit Oral Tablet Take 1,000 Units by mouth Once a day   . diazePAM (VALIUM) 5 mg Oral Tablet Take 5 mg by mouth Every 6 hours as needed for Anxiety   . ibuprofen (MOTRIN) 400 mg Oral Tablet Take 400 mg by mouth Four times a day as needed for Pain   . propranolol (INDERAL) 10 mg Oral Tablet Take 1 Tab (10 mg total) by mouth Per instructions Take one tab PO every 6 hours as needed for tremor   . Sildenafil (VIAGRA) 50 mg Oral Tablet Take 0.5 Tabs (25 mg total) by mouth Every 24 hours as needed   . thyroid (ARMOUR THYROID) 60 mg Oral Tablet Take 60 mg by mouth Once a day     No Known Allergies   Social  History   Substance Use Topics   . Smoking status: Never Smoker   . Smokeless tobacco: Never Used   . Alcohol use No     History   Drug Use No      Family Medical History:     None                ROS:  General: Denies fever, chills, or any changes in weight.  EENT: Denies blurry vision, hearing loss.  Cardiovascular: Denies chest pain or palpitations.  Respiratory: Denies SOB, cough.  Gastrointestinal: Denies abdominal pain, N/V, constipation, or diarrhea. Denies any change in bowel movements or rectal bleeding.  GU: Denies dysuria, change in urinary habits, or hematuria.  Musculoskeletal: Denies weakness or joint pain. Denies trouble moving extremities.  Neurological: Denies HA, dizziness.  Hematologic: Denies hx of bleeding disorders or blood clots.  Psychiatric: Denies changes in mood, anxiety, or depression.    Objective:  BP 132/76  Pulse 78  Temp 36.8 C (98.2 F)  Ht 1.803 m (5\' 11" )  Wt 72.6 kg (160 lb)  SpO2  99%  BMI 22.32 kg/m2    Physical Exam:  Constitutional:  Alert, healthy, well nourished, no acute distress and not in pain.  HEENT: Normocephalic, atraumatic.   Neck: Trachea midline, supple.  Pulmonary: Normal effort and rate observed.  Cardiovascular: Regular rate and rhythm.  Abdomen: Abdomen soft and supple; No tenderness. Non-distended; No masses present. Bowel sounds normal and active in all 4 quadrants.  Ileostomy present  Extremities: No peripheral edema; No cyanosis or clubbing of nails.  Musculoskeletal: Normal muscle strength and tone of all four extremities.  Skin: No rashes or lesions present; Warm and dry. No jaundice.  Psychiatric: Normal mood and affect; Judgement and thought content normal.      Assessment:  ENCOUNTER DIAGNOSES     ICD-10-CM   1. S/p small bowel obstruction Z87.19        Plan:   Keep up with hydration.      Return if symptoms worsen or fail to improve.    He was given the opportunity to ask questions and those questions were appropriately answered. He agreed with  the treatment plan and is encouraged to call with any additional questions or concerns.       Erskine Emery, LPN   I am scribing for, and in the presence of, Dr. Marguerita Beards for services provided on 03/19/2017.  Erskine Emery, LPN   I personally performed the services described in this documentation, as scribed  in my presence, and it is both accurate  and complete.    Marguerita Beards, MD

## 2017-04-12 ENCOUNTER — Other Ambulatory Visit: Payer: Self-pay

## 2017-07-28 ENCOUNTER — Other Ambulatory Visit: Payer: Self-pay | Attending: Nurse Practitioner

## 2017-07-28 ENCOUNTER — Other Ambulatory Visit (INDEPENDENT_AMBULATORY_CARE_PROVIDER_SITE_OTHER): Payer: BLUE CROSS/BLUE SHIELD | Admitting: Emergency Medicine

## 2017-07-28 DIAGNOSIS — Z0000 Encounter for general adult medical examination without abnormal findings: Secondary | ICD-10-CM | POA: Insufficient documentation

## 2017-07-29 LAB — TORCH ANTIBODY, IGG
CMV IGG QUALITATIVE: POSITIVE — AB
HSV-1 IGG QUALITATIVE: NEGATIVE
HSV2 IGG QUALITATIVE: NEGATIVE
RUBELLA IGG TORCH QUALITATIVE: POSITIVE
TOXOPLASMA IGG QUALITATIVE: NEGATIVE

## 2017-07-30 LAB — MUMPS VIRUS ANTIBODY, IGG, SERUM: MUMPS IGG QUALITATIVE: POSITIVE

## 2017-07-30 LAB — MEASLES IGG: MEASLES IGG QUALITATIVE: POSITIVE

## 2018-11-25 DEATH — deceased

## 2018-12-04 LAB — HISTORICAL SURGICAL PATHOLOGY SPECIMEN

## 2018-12-05 LAB — HISTORICAL SURGICAL PATHOLOGY SPECIMEN

## 2020-05-08 ENCOUNTER — Encounter (HOSPITAL_BASED_OUTPATIENT_CLINIC_OR_DEPARTMENT_OTHER): Payer: Self-pay

## 2023-10-14 ENCOUNTER — Encounter (INDEPENDENT_AMBULATORY_CARE_PROVIDER_SITE_OTHER): Payer: Self-pay
